# Patient Record
Sex: Female | Born: 1970 | Race: White | Hispanic: No | Marital: Married | Smoking: Current every day smoker
Health system: Southern US, Community
[De-identification: ages and names within clinical notes are randomized; demographics above are authoritative.]

## PROBLEM LIST (undated history)

## (undated) DIAGNOSIS — J410 Simple chronic bronchitis: Secondary | ICD-10-CM

## (undated) DIAGNOSIS — I2510 Atherosclerotic heart disease of native coronary artery without angina pectoris: Secondary | ICD-10-CM

## (undated) DIAGNOSIS — Z8669 Personal history of other diseases of the nervous system and sense organs: Secondary | ICD-10-CM

## (undated) DIAGNOSIS — F319 Bipolar disorder, unspecified: Secondary | ICD-10-CM

## (undated) DIAGNOSIS — T8859XA Other complications of anesthesia, initial encounter: Secondary | ICD-10-CM

## (undated) DIAGNOSIS — F32A Depression, unspecified: Secondary | ICD-10-CM

## (undated) DIAGNOSIS — R519 Headache, unspecified: Secondary | ICD-10-CM

## (undated) DIAGNOSIS — F329 Major depressive disorder, single episode, unspecified: Secondary | ICD-10-CM

## (undated) DIAGNOSIS — D649 Anemia, unspecified: Secondary | ICD-10-CM

## (undated) DIAGNOSIS — M1990 Unspecified osteoarthritis, unspecified site: Secondary | ICD-10-CM

## (undated) DIAGNOSIS — F1010 Alcohol abuse, uncomplicated: Secondary | ICD-10-CM

## (undated) DIAGNOSIS — I5189 Other ill-defined heart diseases: Secondary | ICD-10-CM

## (undated) DIAGNOSIS — K219 Gastro-esophageal reflux disease without esophagitis: Secondary | ICD-10-CM

## (undated) DIAGNOSIS — Z8659 Personal history of other mental and behavioral disorders: Secondary | ICD-10-CM

## (undated) DIAGNOSIS — I499 Cardiac arrhythmia, unspecified: Secondary | ICD-10-CM

## (undated) DIAGNOSIS — T4145XA Adverse effect of unspecified anesthetic, initial encounter: Secondary | ICD-10-CM

## (undated) DIAGNOSIS — E119 Type 2 diabetes mellitus without complications: Secondary | ICD-10-CM

## (undated) DIAGNOSIS — F1910 Other psychoactive substance abuse, uncomplicated: Secondary | ICD-10-CM

## (undated) DIAGNOSIS — E785 Hyperlipidemia, unspecified: Secondary | ICD-10-CM

## (undated) DIAGNOSIS — F209 Schizophrenia, unspecified: Secondary | ICD-10-CM

## (undated) HISTORY — DX: Type 2 diabetes mellitus without complications: E11.9

## (undated) HISTORY — PX: TUBAL LIGATION: SHX77

## (undated) HISTORY — PX: CERVIX SURGERY: SHX593

## (undated) HISTORY — DX: Hyperlipidemia, unspecified: E78.5

---

## 2006-06-09 ENCOUNTER — Emergency Department (HOSPITAL_COMMUNITY): Admission: EM | Admit: 2006-06-09 | Discharge: 2006-06-09 | Payer: Self-pay | Admitting: Emergency Medicine

## 2006-06-12 ENCOUNTER — Ambulatory Visit (HOSPITAL_COMMUNITY): Admission: RE | Admit: 2006-06-12 | Discharge: 2006-06-12 | Payer: Self-pay | Admitting: Family Medicine
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
## 2010-07-28 ENCOUNTER — Inpatient Hospital Stay: Payer: Self-pay | Admitting: Psychiatry

## 2011-02-15 ENCOUNTER — Inpatient Hospital Stay: Payer: Self-pay | Admitting: Psychiatry

## 2014-06-29 NOTE — Telephone Encounter (Signed)
open in error

## 2015-09-21 ENCOUNTER — Encounter: Payer: Self-pay | Admitting: Emergency Medicine

## 2015-09-21 ENCOUNTER — Emergency Department
Admission: EM | Admit: 2015-09-21 | Discharge: 2015-09-21 | Disposition: A | Discharge status: Home / Self Care | Payer: Medicare Other | Attending: Emergency Medicine | Admitting: Emergency Medicine

## 2015-09-21 DIAGNOSIS — F10120 Alcohol abuse with intoxication, uncomplicated: Secondary | ICD-10-CM | POA: Diagnosis not present

## 2015-09-21 DIAGNOSIS — Z720 Tobacco use: Secondary | ICD-10-CM | POA: Insufficient documentation

## 2015-09-21 DIAGNOSIS — F1910 Other psychoactive substance abuse, uncomplicated: Secondary | ICD-10-CM

## 2015-09-21 DIAGNOSIS — F10129 Alcohol abuse with intoxication, unspecified: Secondary | ICD-10-CM | POA: Diagnosis present

## 2015-09-21 DIAGNOSIS — F1410 Cocaine abuse, uncomplicated: Secondary | ICD-10-CM | POA: Diagnosis not present

## 2015-09-21 DIAGNOSIS — F10920 Alcohol use, unspecified with intoxication, uncomplicated: Secondary | ICD-10-CM

## 2015-09-21 HISTORY — DX: Major depressive disorder, single episode, unspecified: F32.9

## 2015-09-21 HISTORY — DX: Bipolar disorder, unspecified: F31.9

## 2015-09-21 HISTORY — DX: Schizophrenia, unspecified: F20.9

## 2015-09-21 HISTORY — DX: Depression, unspecified: F32.A

## 2015-09-21 HISTORY — DX: Other psychoactive substance abuse, uncomplicated: F19.10

## 2015-09-21 HISTORY — DX: Alcohol abuse, uncomplicated: F10.10

## 2015-09-21 LAB — COMPREHENSIVE METABOLIC PANEL
ALK PHOS: 116 U/L (ref 38–126)
ALT: 30 U/L (ref 14–54)
AST: 27 U/L (ref 15–41)
Albumin: 4.5 g/dL (ref 3.5–5.0)
Anion gap: 10 (ref 5–15)
BUN: 13 mg/dL (ref 6–20)
CALCIUM: 9.4 mg/dL (ref 8.9–10.3)
CO2: 26 mmol/L (ref 22–32)
CREATININE: 0.92 mg/dL (ref 0.44–1.00)
Chloride: 104 mmol/L (ref 101–111)
Glucose, Bld: 110 mg/dL — ABNORMAL HIGH (ref 65–99)
Potassium: 4 mmol/L (ref 3.5–5.1)
Sodium: 140 mmol/L (ref 135–145)
Total Bilirubin: 0.5 mg/dL (ref 0.3–1.2)
Total Protein: 8.5 g/dL — ABNORMAL HIGH (ref 6.5–8.1)

## 2015-09-21 LAB — URINE DRUG SCREEN, QUALITATIVE (ARMC ONLY)
Amphetamines, Ur Screen: NOT DETECTED
Barbiturates, Ur Screen: NOT DETECTED
Benzodiazepine, Ur Scrn: NOT DETECTED
Cannabinoid 50 Ng, Ur ~~LOC~~: NOT DETECTED
Cocaine Metabolite,Ur ~~LOC~~: POSITIVE — AB
MDMA (Ecstasy)Ur Screen: NOT DETECTED
Methadone Scn, Ur: NOT DETECTED
Opiate, Ur Screen: NOT DETECTED
Phencyclidine (PCP) Ur S: NOT DETECTED
Tricyclic, Ur Screen: NOT DETECTED

## 2015-09-21 LAB — CBC
HCT: 43 % (ref 35.0–47.0)
Hemoglobin: 14.2 g/dL (ref 12.0–16.0)
MCH: 31.9 pg (ref 26.0–34.0)
MCHC: 33.1 g/dL (ref 32.0–36.0)
MCV: 96.5 fL (ref 80.0–100.0)
PLATELETS: 370 10*3/uL (ref 150–440)
RBC: 4.46 MIL/uL (ref 3.80–5.20)
RDW: 14.1 % (ref 11.5–14.5)
WBC: 9.6 10*3/uL (ref 3.6–11.0)

## 2015-09-21 LAB — ETHANOL: ALCOHOL ETHYL (B): 192 mg/dL — AB (ref <5)

## 2015-09-21 LAB — SALICYLATE LEVEL: Salicylate Lvl: <4 mg/dL (ref 2.8–30.0)

## 2015-09-21 LAB — ACETAMINOPHEN LEVEL

## 2015-09-21 MED ORDER — HALOPERIDOL DECANOATE 100 MG/ML IM SOLN
100.0000 mg | Freq: Once | INTRAMUSCULAR | Status: AC
  Administered 2015-09-21: 100 mg via INTRAMUSCULAR
  Filled 2015-09-21: qty 1

## 2015-09-21 NOTE — ED Notes (Signed)
Just received med from pharmacy

## 2015-09-21 NOTE — ED Notes (Signed)
Pt here for drug and alcohol detox, reports hx of mental illness (schizophrenia) and is due for injection today but is too intoxicated to receive. Pt reports last alcoholic drink was around 6am, reports drinking half a gallon of liquor. Pt reports crack use, last use this morning.

## 2015-09-21 NOTE — ED Provider Notes (Signed)
[name] Emergency Department Provider Note  ____________________________________________  Time seen: Approximately 305 PM  I have reviewed the triage vital signs and the nursing notes.   HISTORY  Chief Complaint Alcohol Problem and Drug Problem    HPI Ann [name] is a 44 y.o. female with a history of schizophrenia and bipolar disorder who is presenting today from her [place] clinic for intoxication. She was supposed to get her once every 28 day Haldol shot today but was sent in to the emergency department becauseshe was intoxicated when she presented to her clinic. She says that she only drinks about once a week and only uses crack cocaine about once a month. She denies any suicidal or homicidal ideation at this time. She denies any pain. Specifically, no shortness of breath or chest pain. She denies any hallucinations. I asked her and she is not interested in detox or rehabilitation.   Past Medical History  Diagnosis Date  . Schizophrenia ([place])   . Alcohol abuse   . Drug abuse   . Bipolar 1 disorder ([place])   . Depression     There are no active problems to display for this patient.   Past Surgical History  Procedure Laterality Date  . Tubal ligation      No current outpatient prescriptions on file.  Allergies Review of patient's allergies indicates no known allergies.  No family history on file.  Social History Social History  Substance Use Topics  . Smoking status: Current Every Day Smoker    Types: Cigarettes  . Smokeless tobacco: None  . Alcohol Use: Yes     Comment: 1/2 gallon of liquor    Review of Systems Constitutional: No fever/chills Eyes: No visual changes. ENT: No sore throat. Cardiovascular: Denies chest pain. Respiratory: Denies shortness of breath. Gastrointestinal: No abdominal pain.  No nausea, no vomiting.  No diarrhea.  No constipation. Genitourinary: Negative for dysuria. Musculoskeletal: Negative for  back pain. Skin: Negative for rash. Neurological: Negative for headaches, focal weakness or numbness.  10-point ROS otherwise negative.  ____________________________________________   PHYSICAL EXAM:  VITAL SIGNS: ED Triage Vitals  Enc Vitals Group     BP 09/21/15 1310 128/78 mmHg     Pulse Rate 09/21/15 1310 80     Resp 09/21/15 1310 16     Temp 09/21/15 1310 97.7 F (36.5 C)     Temp Source 09/21/15 1310 Oral     SpO2 09/21/15 1310 100 %     Weight 09/21/15 1310 215 lb (97.523 kg)     Height 09/21/15 1310 5\' 6"  (1.676 m)     Head Cir --      Peak Flow --      Pain Score 09/21/15 1310 2     Pain Loc --      Pain Edu? --      Excl. in [place]? --     Constitutional: Alert and oriented. Well appearing and in no acute distress. Eyes: Conjunctivae are normal. PERRL. EOMI. Head: Atraumatic. Nose: No congestion/rhinnorhea. Mouth/Throat: Mucous membranes are moist.  Oropharynx non-erythematous. Neck: No stridor.   Cardiovascular: Normal rate, regular rhythm. Grossly normal heart sounds.  Good peripheral circulation. Respiratory: Normal respiratory effort.  No retractions. Lungs CTAB. Gastrointestinal: Soft and nontender. No distention. No abdominal bruits. No CVA tenderness. Musculoskeletal: No lower extremity tenderness nor edema.  No joint effusions. Neurologic:  Mild slurring of her speech consistent with intoxication. No gross focal neurologic deficits are appreciated. No gait instability. Skin:  Skin is warm, dry and intact. No rash noted. Psychiatric: Mood and affect are normal. behavior is normal.  ____________________________________________   LABS (all labs ordered are listed, but only abnormal results are displayed)  Labs Reviewed  COMPREHENSIVE METABOLIC PANEL - Abnormal; Notable for the following:    Glucose, Bld 110 (*)    Total Protein 8.5 (*)    All other components within normal limits  ETHANOL - Abnormal; Notable for the following:    Alcohol, Ethyl (B)  192 (*)    All other components within normal limits  URINE DRUG SCREEN, QUALITATIVE ([facility] ONLY) - Abnormal; Notable for the following:    Cocaine Metabolite,[place] POSITIVE (*)    All other components within normal limits  CBC  ACETAMINOPHEN LEVEL  SALICYLATE LEVEL   ____________________________________________  EKG   ____________________________________________  RADIOLOGY   ____________________________________________   PROCEDURES    ____________________________________________   INITIAL IMPRESSION / ASSESSMENT AND PLAN / ED COURSE  Pertinent labs & imaging results that were available during my care of the patient were reviewed by me and considered in my medical decision making (see chart for details).  Plan is to allow patient to sober and then give her her Haldol shot.  ----------------------------------------- 6:13 PM on 09/21/2015 -----------------------------------------  Patient is awake and alert and clinically sober at this time. She is no longer having any slurred speech. She is able to ambulate without any assistance on her own. I will give her her Haldol injection and she will follow-up with her [facility] team as scheduled. She continues to deny any suicidal or homicidal ideation or any other complaints. She continues to deny wanting any rehabilitation or detox help. She is not acutely psychotic at this time and has decisional capacity. ____________________________________________   FINAL CLINICAL IMPRESSION(S) / ED DIAGNOSES  Alcohol and cocaine abuse. Alcohol intoxication.    Ann Pyo, MD 09/21/15 [phone]

## 2015-09-21 NOTE — Discharge Instructions (Signed)
Alcohol Intoxication  Alcohol intoxication occurs when you drink enough alcohol that it affects your ability to function. It can be mild or very severe. Drinking a lot of alcohol in a short time is called binge drinking. This can be very harmful. Drinking alcohol can also be more dangerous if you are taking medicines or other drugs. Some of the effects caused by alcohol may include:  · Loss of coordination.  · Changes in mood and behavior.  · Unclear thinking.  · Trouble talking (slurred speech).  · Throwing up (vomiting).  · Confusion.  · Slowed breathing.  · Twitching and shaking (seizures).  · Loss of consciousness.  HOME CARE  · Do not drive after drinking alcohol.  · Drink enough water and fluids to keep your pee (urine) clear or pale yellow. Avoid caffeine.  · Only take medicine as told by your doctor.  GET HELP IF:  · You throw up (vomit) many times.  · You do not feel better after a few days.  · You frequently have alcohol intoxication. Your doctor can help decide if you should see a substance use treatment counselor.  GET HELP RIGHT AWAY IF:  · You become shaky when you stop drinking.  · You have twitching and shaking.  · You throw up blood. It may look bright red or like coffee grounds.  · You notice blood in your poop (bowel movements).  · You become lightheaded or pass out (faint).  MAKE SURE YOU:   · Understand these instructions.  · Will watch your condition.  · Will get help right away if you are not doing well or get worse.     This information is not intended to replace advice given to you by your health care provider. Make sure you discuss any questions you have with your health care provider.     Document Released: 04/30/2008 Document Revised: 07/15/2013 Document Reviewed: 04/17/2013  Elsevier Interactive Patient Education ©2016 [employer]

## 2015-09-21 NOTE — ED Notes (Signed)

## 2015-09-21 NOTE — ED Notes (Addendum)
I called pharmacy for IM med   Pt to be discharged to home - pending IM adminstration

## 2015-09-21 NOTE — ED Notes (Signed)
BEHAVIORAL HEALTH ROUNDING Patient sleeping: Yes.   Patient alert and oriented: eyes closed  Appears asleep Behavior appropriate: Yes.  ; If no, describe:  Nutrition and fluids offered: Yes  Toileting and hygiene offered: sleeping Sitter present: q 15 minute observations and security camera monitoring Law enforcement present: yes  ODS 

## 2015-09-21 NOTE — ED Notes (Signed)
Supper provided along with an extra drink  [place] Patient sleeping: No. Patient alert and oriented: yes Behavior appropriate: Yes.  ; If no, describe:  Nutrition and fluids offered: yes Toileting and hygiene offered: Yes  Sitter present: q15 minute observations and security camera monitoring Law enforcement present: Yes  ODS

## 2015-09-21 NOTE — ED Notes (Signed)
BEHAVIORAL HEALTH ROUNDING Patient sleeping: No. Patient alert and oriented: yes Behavior appropriate: Yes.  ; If no, describe:  Nutrition and fluids offered: yes Toileting and hygiene offered: Yes  Sitter present: q15 minute observations and security camera monitoring Law enforcement present: Yes  ODS  

## 2016-05-17 ENCOUNTER — Other Ambulatory Visit: Payer: Self-pay | Admitting: Student

## 2016-05-17 DIAGNOSIS — R1310 Dysphagia, unspecified: Secondary | ICD-10-CM

## 2016-05-22 ENCOUNTER — Ambulatory Visit: Payer: Medicare Other

## 2017-05-27 ENCOUNTER — Other Ambulatory Visit: Payer: Self-pay | Admitting: Family

## 2017-05-27 DIAGNOSIS — IMO0002 Reserved for concepts with insufficient information to code with codable children: Secondary | ICD-10-CM

## 2017-05-27 DIAGNOSIS — R229 Localized swelling, mass and lump, unspecified: Principal | ICD-10-CM

## 2017-06-06 ENCOUNTER — Ambulatory Visit: Admission: RE | Admit: 2017-06-06 | Payer: Medicare Other | Source: Ambulatory Visit

## 2017-09-09 ENCOUNTER — Ambulatory Visit
Admission: RE | Admit: 2017-09-09 | Discharge: 2017-09-09 | Disposition: A | Discharge status: Home / Self Care | Payer: Medicare Other | Source: Ambulatory Visit | Attending: Family | Admitting: Family

## 2017-09-09 DIAGNOSIS — IMO0002 Reserved for concepts with insufficient information to code with codable children: Secondary | ICD-10-CM

## 2017-09-09 DIAGNOSIS — R229 Localized swelling, mass and lump, unspecified: Secondary | ICD-10-CM

## 2017-09-09 DIAGNOSIS — R2231 Localized swelling, mass and lump, right upper limb: Secondary | ICD-10-CM | POA: Diagnosis not present

## 2018-04-23 ENCOUNTER — Other Ambulatory Visit: Payer: Self-pay

## 2018-04-23 ENCOUNTER — Encounter
Admission: RE | Admit: 2018-04-23 | Discharge: 2018-04-23 | Disposition: A | Discharge status: Home / Self Care | Payer: Medicare Other | Source: Ambulatory Visit | Attending: Obstetrics & Gynecology | Admitting: Obstetrics & Gynecology

## 2018-04-23 HISTORY — DX: Adverse effect of unspecified anesthetic, initial encounter: T41.45XA

## 2018-04-23 HISTORY — DX: Other complications of anesthesia, initial encounter: T88.59XA

## 2018-04-23 HISTORY — DX: Gastro-esophageal reflux disease without esophagitis: K21.9

## 2018-04-23 HISTORY — DX: Unspecified osteoarthritis, unspecified site: M19.90

## 2018-04-23 HISTORY — DX: Anemia, unspecified: D64.9

## 2018-04-23 NOTE — Patient Instructions (Signed)
Your procedure is scheduled on: 04-28-18 MONDAY Report to Same Day Surgery 2nd floor medical mall [facility] Entrance-take elevator on left to 2nd floor.  Check in with surgery information desk.) To find out your arrival time please call [phone] between 1PM - 3PM on 04-25-18 FRIDAY  Remember: Instructions that are not followed completely may result in serious medical risk, up to and including death, or upon the discretion of your surgeon and anesthesiologist your surgery may need to be rescheduled.    _x___ 1. Do not eat food after midnight the night before your procedure. NO GUM OR CANDY AFTER MIDNIGHT.  You may drink clear liquids up to 2 hours before you are scheduled to arrive at the hospital for your procedure.  Do not drink clear liquids within 2 hours of your scheduled arrival to the hospital.  Clear liquids include  --Water or Apple juice without pulp  --Clear carbohydrate beverage such as ClearFast or Gatorade  --Black Coffee or Clear Tea (No milk, no creamers, do not add anything to the coffee or Tea    __x__ 2. No Alcohol for 24 hours before or after surgery.   __x__3. No Smoking or e-cigarettes for 24 prior to surgery.  Do not use any chewable tobacco products for at least 6 hour prior to surgery   ____  4. Bring all medications with you on the day of surgery if instructed.    __x__ 5. Notify your doctor if there is any change in your medical condition     (cold, fever, infections).    x___6. On the morning of surgery brush your teeth with toothpaste and water.  You may rinse your mouth with mouth wash if you wish.  Do not swallow any toothpaste or mouthwash.   Do not wear jewelry, make-up, hairpins, clips or nail polish.  Do not wear lotions, powders, or perfumes. You may wear deodorant.  Do not shave 48 hours prior to surgery. Men may shave face and neck.  Do not bring valuables to the hospital.    [facility] is not responsible for any belongings or  valuables.               Contacts, dentures or bridgework may not be worn into surgery.  Leave your suitcase in the car. After surgery it may be brought to your room.  For patients admitted to the hospital, discharge time is determined by your  treatment team.  _  Patients discharged the day of surgery will not be allowed to drive home.  You will need someone to drive you home and stay with you the night of your procedure.    Please read over the following fact sheets that you were given:   [facility] Preparing for Surgery and or MRSA Information   _x___ TAKE THE FOLLOWING MEDICATIONS THE MORNING OF SURGERY WITH A SMALL SIP OF WATER. These include:  1. LIPITOR (ATORVASTATIN)  2. WELLBUTRIN (BUPROPION)  3. PROZAC (FLUOXETINE)  4. PRILOSEC (OMEPRAZOLE)  5. TAKE AN EXTRA OMEPRAZOLE THE NIGHT BEFORE YOUR SURGERY   6.  ____Fleets enema or Magnesium Citrate as directed.   _x___ Use CHG Soap or sage wipes as directed on instruction sheet   ____ Use inhalers on the day of surgery and bring to hospital day of surgery  ____ Stop Metformin and Janumet 2 days prior to surgery.    ____ Take 1/2 of usual insulin dose the night before surgery and none on the morning surgery.   ____  Follow recommendations from Cardiologist, Pulmonologist or PCP regarding stopping Aspirin, Coumadin, Plavix ,Eliquis, Effient, or Pradaxa, and Pletal.  X____Stop Anti-inflammatories such as Advil, Aleve, Ibuprofen, Motrin, Naproxen, Naprosyn, Goodies powders or aspirin products NOW-OK to take Tylenol   ____ Stop supplements until after surgery.     ____ Bring C-Pap to the hospital.

## 2018-04-24 ENCOUNTER — Encounter
Admission: RE | Admit: 2018-04-24 | Discharge: 2018-04-24 | Disposition: A | Discharge status: Home / Self Care | Payer: Medicare Other | Source: Ambulatory Visit | Attending: Obstetrics & Gynecology | Admitting: Obstetrics & Gynecology

## 2018-04-24 DIAGNOSIS — R9389 Abnormal findings on diagnostic imaging of other specified body structures: Secondary | ICD-10-CM | POA: Insufficient documentation

## 2018-04-24 DIAGNOSIS — Z825 Family history of asthma and other chronic lower respiratory diseases: Secondary | ICD-10-CM | POA: Insufficient documentation

## 2018-04-24 DIAGNOSIS — Z8371 Family history of colonic polyps: Secondary | ICD-10-CM | POA: Insufficient documentation

## 2018-04-24 DIAGNOSIS — F17210 Nicotine dependence, cigarettes, uncomplicated: Secondary | ICD-10-CM | POA: Diagnosis not present

## 2018-04-24 DIAGNOSIS — K219 Gastro-esophageal reflux disease without esophagitis: Secondary | ICD-10-CM | POA: Diagnosis not present

## 2018-04-24 DIAGNOSIS — Z8249 Family history of ischemic heart disease and other diseases of the circulatory system: Secondary | ICD-10-CM | POA: Diagnosis not present

## 2018-04-24 DIAGNOSIS — N950 Postmenopausal bleeding: Secondary | ICD-10-CM | POA: Diagnosis not present

## 2018-04-24 DIAGNOSIS — F209 Schizophrenia, unspecified: Secondary | ICD-10-CM | POA: Diagnosis not present

## 2018-04-24 DIAGNOSIS — Z79899 Other long term (current) drug therapy: Secondary | ICD-10-CM | POA: Insufficient documentation

## 2018-04-24 DIAGNOSIS — Z01812 Encounter for preprocedural laboratory examination: Secondary | ICD-10-CM | POA: Insufficient documentation

## 2018-04-24 DIAGNOSIS — E785 Hyperlipidemia, unspecified: Secondary | ICD-10-CM | POA: Insufficient documentation

## 2018-04-24 DIAGNOSIS — Z833 Family history of diabetes mellitus: Secondary | ICD-10-CM | POA: Diagnosis not present

## 2018-04-24 LAB — CBC
HEMATOCRIT: 42.4 % (ref 35.0–47.0)
HEMOGLOBIN: 14.8 g/dL (ref 12.0–16.0)
MCH: 33.9 pg (ref 26.0–34.0)
MCHC: 34.9 g/dL (ref 32.0–36.0)
MCV: 97.4 fL (ref 80.0–100.0)
Platelets: 367 10*3/uL (ref 150–440)
RBC: 4.35 MIL/uL (ref 3.80–5.20)
RDW: 13.7 % (ref 11.5–14.5)
WBC: 10 10*3/uL (ref 3.6–11.0)

## 2018-04-24 LAB — BASIC METABOLIC PANEL
Anion gap: 10 (ref 5–15)
BUN: 6 mg/dL (ref 6–20)
CHLORIDE: 103 mmol/L (ref 101–111)
CO2: 26 mmol/L (ref 22–32)
CREATININE: 0.8 mg/dL (ref 0.44–1.00)
Calcium: 9.5 mg/dL (ref 8.9–10.3)
GFR calc Af Amer: >60 mL/min (ref >60)
GFR calc non Af Amer: >60 mL/min (ref >60)
Glucose, Bld: 126 mg/dL — ABNORMAL HIGH (ref 65–99)
Potassium: 4.4 mmol/L (ref 3.5–5.1)
SODIUM: 139 mmol/L (ref 135–145)

## 2018-04-24 NOTE — Pre-Procedure Instructions (Addendum)
Called over to dr wards office on 04-23-18 and informed office dr ward needs to put her orders in.  Fax request also sent on 04-22-18 to Dr Wards office informing her she needs to put her orders in.  Pt now here in PAT and still no orders.  Only drawing labs that Anesthesia will need cbc and metb

## 2018-04-25 ENCOUNTER — Encounter: Payer: Self-pay | Admitting: *Deleted

## 2018-04-25 NOTE — Pre-Procedure Instructions (Signed)
Echocardiogram W Colorflow Spectral Doppler With Contrast10/21/2016 [facility] Result Narrative   Technically difficult study due to chest wall/lung interference  Echo contrast utilized to enhance endocardial border definition  Normal left ventricular systolic function, ejection fraction 55 to 60%  Normal right ventricular systolic function  Status Results Details   Encounter Summary

## 2018-04-25 NOTE — Pre-Procedure Instructions (Signed)
NM Myocardial Perfusion Spect Multiple10/19/2016 [facility] Result Narrative   Exam: NM MYOCARDIAL PERFUSION SPECT MULTIPLE (One-Day Rest/Stress Tc-73M  Sestamibi - Regadenoson)  ++++++++++++++++++++++++++++++++++++++++++++ Impressions: - Low risk study  - There is a small in size, mild in severity, fixed defect involving the  mid anterior and apical anterior segments.This is consistent with  attenuation artifact.  - Post stress:The ejection fraction was greater than 65%.  - Breast attenuation is noted  - Sensitivity and specificity of this test are reduced by the noted  attenuation  ++++++++++++++++++++++++++++++++++++++++++++  Name: Ann [name], Ann [name] (Female) Date of Birth: 1970-12-15 [id number]) Accession #: [id number] Candida Peeling ID: [id number] Date of Procedure: 09/14/2015 PrimaryAttending: CodySDeen  Clinical Indications: chest pain and arrhythmia Cardiac Risk Factors: diabetes and hyperlipidemia Past Cardiac Procedures: none reported Medications: none reported Other Conditions: none reported  Protocol: One-Day Rest/Stress Tc-73M Sestamibi - Regadenoson The patient was brought to the nuclear laboratory and given 11.0 mCi of  Tc-88m sestamibi intravenously.Approximately 15 minutes later, the  patient had the resting images performed.The patient was given a 0.4 mg  dose of regadenoson intravenously over 15 seconds.Exercise was not  attempted because of the patient's preference.After the IV  administration of 32.7 mCi of Tc-54m sestamibi at peak exercise, stress  tomographic images of the heart were obtained. Motion Issues: none Attenuation Issues: moderate breast Other Quality Issues: no TID, no significant gut activity was noted and no  lung uptake  Clinical Findings: Ht: 66 in (168 cm)Wt: 215 lb (97.5 kg)BSA: 2.06  m2 Exercise: No exercise is performed for this type of protocol. Baseline: HR = 67 BP = 136/74 Peak: HR = 81 BP = 124/68 Baseline EKG:  1st degree AV block and non-specific ST T-wave changes Baseline Arrhythmia: none reported Stress EKG: non-specific T-wave abnormality Stress Arrhythmia: none reported Stress Symptoms: typical regadenoson symptoms  Nuclear Perfusion Findings: There is a small in size, mild in severity, fixed defect involving the mid  anterior and apical anterior segments.This is consistent with  attenuation artifact.  Nuclear Wall Motion Findings: Post stress:Global systolic function is normal.The ejection fraction  was greater than 65%.  I ([place]) wasavailable for the stress portion of the test.   Status Results Details   Encounter Summary

## 2018-04-25 NOTE — Pre-Procedure Instructions (Signed)
PT DENIED ANY HEART ISSUES DURING PHONE INTERVIEW ON 04-23-18. PT CAME IN FOR PREOP LABS ON 04-24-18.  ALL WNL.  UPON PUTTING PTS CHART TOGETHER ON 5-31 FOR HER SURGERY ON 6-3, I FOUND A CARDIOLOGY NOTE FROM 2016 THAT STATED SVT AND POSSIBLE A-FLUTTER. PT NEVER MENTIONED THIS.  EKG WAS NOT DONE WHEN SHE CAME IN FOR HER LABS.  STRESS AND ECHO DONE IN 2016.  WILL DO EKG AM OF SURGERY

## 2018-04-25 NOTE — Pre-Procedure Instructions (Signed)
Progress Notes - documented in this encounter  Baxter Hire, MD - 10/13/2015 9:37 AM EST Formatting of this note might be different from the original.   [facility] [place]  Date of Service: 10/13/2015  New Consultation Clinic Note  PCP: Referring Provider:  Neysa Hotter, PA [address] [place] [address] Phone: [phone] Fax: [place], [place] [address] [place], [place] [address] Phone: [phone] Fax: [phone]   Assessment and Plan:   [name] is a 47 y.o. female with past medical history of diabetes mellitus, hyperlipidemia, who was referred to [place] for possible "atrial flutter" on recent EKG, and the symptoms of chest pain.    1. Chest pain. Patient reported nonexertional intermittent chest pain has resolved. Nuclear stress test and echocardiography were both unremarkable. Patient will be discharged to her PCP, and will call us back if her symptoms worsen or any new symptoms developed.   2. Questionable atrial flutter. Zio patch for 10 days revealed predominately sinus rhythm, occasional SVT, no atrial flutter/fibrillation. She will be monitored by her PCP for new or worsening symptoms. A longer duration of monitoring will be considered if she still has symptoms.   3. Hyperlipidemia.Managed by her primary care physician.  4. Diabetes mellitus. Managed by her primary care physician.   Return if symptoms worsen or fail to improve.  No orders of the defined types were placed in this encounter.   The patient was seen and discussed with Dr. Charletta Cousin.  Subjective:    Reason for Consultation: Chest pain and possible atrial flutter  History of Present Illness: [name] is a 47 y.o. female with past medical history of diabetes mellitus, hyperlipidemia, .The patient is seen at the request of Neysa Hotter, [place], for evaluation of chest pain and possible atrial flutter.  Patient reported that the  previously reported nonexertional intermittent chest pain has completely resolved. She has currently denied any symptoms of chest pain, shortness of breath, dizziness, palpitations, or any other symptoms.  Her recent nuclear stress test and echocardiogram were both unremarkable. A 10 day course monitoring of her heart rhythm by a Ziopatch failed to reveal any atrial flutter/fibrillation.   Cardiovascular History and relevant Past Medical History: Hyperlipidemia Diabetes mellitus  Cardiovascular Studies Date Comments  Nuclear Stress test 09/14/2015 Low risk study  - There is a small in size, mild in severity, fixed defect involving the mid anterior and apical anterior segments. This is consistent with attenuation artifact.  - Post stress: The ejection fraction was greater than 65%.  - Breast attenuation is noted  - Sensitivity and specificity of this test are reduced by the noted attenuation  Echocardiogram 09/16/2015  Technically difficult study due to chest wall/lung interference  Echo contrast utilized to enhance endocardial border definition  Normal left ventricular systolic function, ejection fraction 55 to 60%  Normal right ventricular systolic function  Ziopatch 09/26/2015 Ambulatory ECG monitoring was performed from 09/16/2015 to 09/26/2015. - The predominant rhythm was sinus rhythm, with the rate ranging from 42 to 143 and averaging 81 bpm. - Rare supraventricular ectopics were recorded, with no complex or sustained arrhythmias.  - No ventricular ectopics were recorded, with no complex or sustained arrhythmias. - Patient-initiated recordings/events revealed sinus rhythm. - The patient reported chest pain and palpitations. - No presence of atrial flutter.  ECG 09/01/2015 Sinus rhythm, nonspecific ST T changes.  Past medical history was reviewed in EPIC.  Medications:  Current Outpatient Prescriptions  Medication  Sig Dispense Refill  . atorvastatin (LIPITOR) 40 MG  tablet Take 40 mg by mouth daily.  Marland Kitchen buPROPion (WELLBUTRIN XL) 300 MG 24 hr tablet Take 300 mg by mouth daily.  . calcium carbonate-vitamin D3 600 mg(1,500mg ) -200 unit per tablet Take 1 tablet by mouth daily.  Marland Kitchen FLUoxetine (PROZAC) 20 MG capsule Take 20 mg by mouth daily.  . multivit-iron-min-folic acid (MULTIVITAMIN-IRON-MINERALS-FOLIC ACID) 2,563-89-3.7 unit-mg-mg Chew Chew 1 tablet daily.  Marland Kitchen omeprazole (PRILOSEC) 20 MG capsule Take 20 mg by mouth daily.  . naproxen (NAPROSYN) 500 MG tablet Take 500 mg by mouth every twelve (12) hours as needed.   No current facility-administered medications for this visit.   Allergies: No Known Allergies  Social History: She reports that she has been smoking Cigarettes. She has been smoking about 0.50 packs per day. She does not have any smokeless tobacco history on file. She reports that she drinks about 3.6 - 4.8 oz of alcohol per week. She reports that she does not use illicit drugs. Family History:Her family history is not on file.  Review of Systems 10 systems were reviewed and negative except as noted in HPI.   Objective:   Physical Exam BP 104/71 mmHg  Pulse 68  Wt 96.707 kg (213 lb 3.2 oz)  SpO2 98%  Wt Readings from Last 3 Encounters:  10/13/15 96.707 kg (213 lb 3.2 oz)  09/01/15 97.523 kg (215 lb)   General: Alert, no distress.  HEENT: EOMI, sclerae anicteric, MMM.  Neck: Supple, no carotid bruit. JVD normal.  Lungs: CTAB bilaterally with normal WOB.  Heart: RRR without m/r/g.  Abdomen: s/NTND.  Extremities: 2+ radial, PT and DP pulses bilaterally. No edema bilaterally.  Skin: No lesions/rashes.  Neurologic: No focal deficits.   Most recent labs  Lab Results  Component Value Date  NA 143 05/31/2011  K 4.4 05/31/2011  CL 103 05/31/2011  CO2 28 05/31/2011  BUN 12 05/31/2011  CREATININE 0.74 05/31/2011  MG 2.1 05/15/2011   Lab Results  Component Value Date  HGB 14.1 05/10/2011  MCV 95 05/10/2011  PLT 403 05/10/2011     Lab Results  Component Value Date  CHOL 258* 05/31/2011  TRIG 176* 05/31/2011  HDL 47 05/31/2011  NONHDL 211 05/31/2011  LDL 176 05/31/2011  TSH 1.53 05/10/2011     Baxter Hire, MD Cardiovascular Diseases Subspecialty Resident, PGY 5   Electronically signed by Angela Burke, MD at 10/13/2015 5:32 PM EST    Associated attestation - Angela Burke, MD - 10/13/2015 5:32 PM EST  I saw and evaluated the patient, participating in the key portions of the service. I reviewed the resident's note. I agree with the resident's findings and plan.   Marjean Donna, MD      Plan of Treatment - documented as of this encounter  Not on file    Visit Diagnoses - documented in this encounter  Diagnosis  Chest pain, unspecified type - Primary   No evidence of atrial fibrillation or flutter   Hyperlipidemia, unspecified hyperlipidemia type    Images Patient Contacts   Contact Name Contact Address Communication Relationship to Patient  Laverta [place] Unknown [phone] [facility]) Emergency Contact   Document Information  Primary Care Provider Other Service Providers Document Coverage Dates  Neysa Hotter PA (Sep. 19, 2016September 19, 2016 - Jul. 10, 2017July 10, 2017)   Nov. 17, 2016November 17, 2016   [place] [address] [place], [place] [address]   Encounter Providers  Encounter Date  Tacy Learn MD (Attending) [phone] (Work) [phone] (Fax) [address] [place], [place] [address]  Nov. 17, 2016November 17, 2016

## 2018-04-28 ENCOUNTER — Encounter
Admission: RE | Disposition: A | Discharge status: Home / Self Care | Payer: Self-pay | Source: Ambulatory Visit | Attending: Obstetrics & Gynecology

## 2018-04-28 ENCOUNTER — Ambulatory Visit: Payer: Medicare Other | Admitting: Anesthesiology

## 2018-04-28 ENCOUNTER — Encounter: Payer: Self-pay | Admitting: *Deleted

## 2018-04-28 ENCOUNTER — Ambulatory Visit
Admission: RE | Admit: 2018-04-28 | Discharge: 2018-04-28 | Disposition: A | Discharge status: Home / Self Care | Payer: Medicare Other | Source: Ambulatory Visit | Attending: Obstetrics & Gynecology | Admitting: Obstetrics & Gynecology

## 2018-04-28 DIAGNOSIS — N840 Polyp of corpus uteri: Secondary | ICD-10-CM | POA: Insufficient documentation

## 2018-04-28 DIAGNOSIS — N950 Postmenopausal bleeding: Secondary | ICD-10-CM

## 2018-04-28 DIAGNOSIS — Z79899 Other long term (current) drug therapy: Secondary | ICD-10-CM | POA: Diagnosis not present

## 2018-04-28 DIAGNOSIS — F17210 Nicotine dependence, cigarettes, uncomplicated: Secondary | ICD-10-CM | POA: Insufficient documentation

## 2018-04-28 DIAGNOSIS — N939 Abnormal uterine and vaginal bleeding, unspecified: Secondary | ICD-10-CM | POA: Diagnosis present

## 2018-04-28 DIAGNOSIS — R9389 Abnormal findings on diagnostic imaging of other specified body structures: Secondary | ICD-10-CM | POA: Diagnosis present

## 2018-04-28 DIAGNOSIS — F209 Schizophrenia, unspecified: Secondary | ICD-10-CM | POA: Insufficient documentation

## 2018-04-28 DIAGNOSIS — F319 Bipolar disorder, unspecified: Secondary | ICD-10-CM | POA: Diagnosis not present

## 2018-04-28 DIAGNOSIS — D649 Anemia, unspecified: Secondary | ICD-10-CM | POA: Insufficient documentation

## 2018-04-28 DIAGNOSIS — K219 Gastro-esophageal reflux disease without esophagitis: Secondary | ICD-10-CM | POA: Insufficient documentation

## 2018-04-28 HISTORY — DX: Cardiac arrhythmia, unspecified: I49.9

## 2018-04-28 HISTORY — PX: HYSTEROSCOPY W/D&C: SHX1775

## 2018-04-28 HISTORY — PX: POLYPECTOMY: SHX5525

## 2018-04-28 LAB — URINE DRUG SCREEN, QUALITATIVE (ARMC ONLY)
Amphetamines, Ur Screen: NOT DETECTED
Barbiturates, Ur Screen: NOT DETECTED
Benzodiazepine, Ur Scrn: NOT DETECTED
CANNABINOID 50 NG, UR ~~LOC~~: NOT DETECTED
Cocaine Metabolite,Ur ~~LOC~~: NOT DETECTED
MDMA (ECSTASY) UR SCREEN: NOT DETECTED
Methadone Scn, Ur: NOT DETECTED
Opiate, Ur Screen: NOT DETECTED
PHENCYCLIDINE (PCP) UR S: NOT DETECTED
Tricyclic, Ur Screen: NOT DETECTED

## 2018-04-28 LAB — POCT PREGNANCY, URINE: PREG TEST UR: NEGATIVE

## 2018-04-28 SURGERY — DILATATION AND CURETTAGE /HYSTEROSCOPY
Anesthesia: General | Wound class: Clean Contaminated

## 2018-04-28 MED ORDER — ONDANSETRON HCL 4 MG/2ML IJ SOLN
INTRAMUSCULAR | Status: AC
  Filled 2018-04-28: qty 2

## 2018-04-28 MED ORDER — ACETAMINOPHEN 325 MG PO TABS: 650.0000 mg | ORAL_TABLET | ORAL | Status: DC | PRN

## 2018-04-28 MED ORDER — ROCURONIUM BROMIDE 100 MG/10ML IV SOLN
INTRAVENOUS | Status: DC | PRN
  Administered 2018-04-28: 5 mg via INTRAVENOUS
  Administered 2018-04-28: 20 mg via INTRAVENOUS

## 2018-04-28 MED ORDER — ACETAMINOPHEN 650 MG RE SUPP
650.0000 mg | RECTAL | Status: DC | PRN
  Filled 2018-04-28: qty 1

## 2018-04-28 MED ORDER — ROCURONIUM BROMIDE 50 MG/5ML IV SOLN
INTRAVENOUS | Status: AC
  Filled 2018-04-28: qty 1

## 2018-04-28 MED ORDER — MIDAZOLAM HCL 2 MG/2ML IJ SOLN
INTRAMUSCULAR | Status: AC
  Filled 2018-04-28: qty 2

## 2018-04-28 MED ORDER — IPRATROPIUM-ALBUTEROL 0.5-2.5 (3) MG/3ML IN SOLN
RESPIRATORY_TRACT | Status: AC
  Filled 2018-04-28: qty 3

## 2018-04-28 MED ORDER — FENTANYL CITRATE (PF) 100 MCG/2ML IJ SOLN
INTRAMUSCULAR | Status: DC | PRN
  Administered 2018-04-28: 50 ug via INTRAVENOUS

## 2018-04-28 MED ORDER — SUCCINYLCHOLINE CHLORIDE 20 MG/ML IJ SOLN
INTRAMUSCULAR | Status: DC | PRN
  Administered 2018-04-28: 100 mg via INTRAVENOUS

## 2018-04-28 MED ORDER — FENTANYL CITRATE (PF) 100 MCG/2ML IJ SOLN: 25.0000 ug | INTRAMUSCULAR | Status: DC | PRN

## 2018-04-28 MED ORDER — ONDANSETRON HCL 4 MG/2ML IJ SOLN: 4.0000 mg | Freq: Once | INTRAMUSCULAR | Status: DC | PRN

## 2018-04-28 MED ORDER — DEXAMETHASONE SODIUM PHOSPHATE 10 MG/ML IJ SOLN
INTRAMUSCULAR | Status: AC
  Filled 2018-04-28: qty 1

## 2018-04-28 MED ORDER — SUCCINYLCHOLINE CHLORIDE 20 MG/ML IJ SOLN
INTRAMUSCULAR | Status: AC
  Filled 2018-04-28: qty 1

## 2018-04-28 MED ORDER — PROPOFOL 10 MG/ML IV BOLUS
INTRAVENOUS | Status: AC
  Filled 2018-04-28: qty 20

## 2018-04-28 MED ORDER — PROPOFOL 10 MG/ML IV BOLUS
INTRAVENOUS | Status: DC | PRN
  Administered 2018-04-28: 150 mg via INTRAVENOUS

## 2018-04-28 MED ORDER — LIDOCAINE HCL (CARDIAC) PF 100 MG/5ML IV SOSY
PREFILLED_SYRINGE | INTRAVENOUS | Status: DC | PRN
  Administered 2018-04-28: 100 mg via INTRAVENOUS

## 2018-04-28 MED ORDER — KETOROLAC TROMETHAMINE 30 MG/ML IJ SOLN
30.0000 mg | Freq: Four times a day (QID) | INTRAMUSCULAR | Status: DC
  Filled 2018-04-28: qty 1

## 2018-04-28 MED ORDER — SUGAMMADEX SODIUM 200 MG/2ML IV SOLN
INTRAVENOUS | Status: DC | PRN
  Administered 2018-04-28: 100 mg via INTRAVENOUS

## 2018-04-28 MED ORDER — ONDANSETRON HCL 4 MG/2ML IJ SOLN
INTRAMUSCULAR | Status: DC | PRN
  Administered 2018-04-28: 4 mg via INTRAVENOUS

## 2018-04-28 MED ORDER — MIDAZOLAM HCL 2 MG/2ML IJ SOLN
INTRAMUSCULAR | Status: DC | PRN
  Administered 2018-04-28: 2 mg via INTRAVENOUS

## 2018-04-28 MED ORDER — LIDOCAINE HCL (PF) 2 % IJ SOLN
INTRAMUSCULAR | Status: AC
  Filled 2018-04-28: qty 10

## 2018-04-28 MED ORDER — FENTANYL CITRATE (PF) 100 MCG/2ML IJ SOLN
INTRAMUSCULAR | Status: AC
  Filled 2018-04-28: qty 2

## 2018-04-28 MED ORDER — KETOROLAC TROMETHAMINE 30 MG/ML IJ SOLN
INTRAMUSCULAR | Status: AC
  Filled 2018-04-28: qty 1

## 2018-04-28 MED ORDER — IPRATROPIUM-ALBUTEROL 0.5-2.5 (3) MG/3ML IN SOLN
3.0000 mL | RESPIRATORY_TRACT | Status: DC
  Administered 2018-04-28: 3 mL via RESPIRATORY_TRACT

## 2018-04-28 MED ORDER — LACTATED RINGERS IV SOLN
INTRAVENOUS | Status: DC
  Administered 2018-04-28: 11:00:00 via INTRAVENOUS

## 2018-04-28 MED ORDER — LACTATED RINGERS IV SOLN: INTRAVENOUS | Status: DC

## 2018-04-28 MED ORDER — SUGAMMADEX SODIUM 200 MG/2ML IV SOLN
INTRAVENOUS | Status: AC
  Filled 2018-04-28: qty 2

## 2018-04-28 MED ORDER — MORPHINE SULFATE (PF) 4 MG/ML IV SOLN: 1.0000 mg | INTRAVENOUS | Status: DC | PRN

## 2018-04-28 MED ORDER — DEXAMETHASONE SODIUM PHOSPHATE 10 MG/ML IJ SOLN
INTRAMUSCULAR | Status: DC | PRN
  Administered 2018-04-28: 5 mg via INTRAVENOUS

## 2018-04-28 SURGICAL SUPPLY — 22 items
CANISTER SUC SOCK COL 7IN (MISCELLANEOUS) ×4 IMPLANT
CATH ROBINSON RED A/P 16FR (CATHETERS) ×4 IMPLANT
DEVICE MYOSURE LITE (MISCELLANEOUS) IMPLANT
DEVICE MYOSURE REACH (MISCELLANEOUS) IMPLANT
ELECT REM PT RETURN 9FT ADLT (ELECTROSURGICAL) ×4
ELECTRODE REM PT RTRN 9FT ADLT (ELECTROSURGICAL) ×2 IMPLANT
GLOVE PI ORTHOPRO 6.5 (GLOVE) ×2
GLOVE PI ORTHOPRO STRL 6.5 (GLOVE) ×2 IMPLANT
GLOVE SURG SYN 6.5 ES PF (GLOVE) ×8 IMPLANT
GOWN STRL REUS W/ TWL LRG LVL3 (GOWN DISPOSABLE) ×4 IMPLANT
GOWN STRL REUS W/TWL LRG LVL3 (GOWN DISPOSABLE) ×8
KIT TURNOVER CYSTO (KITS) ×4 IMPLANT
PACK DNC HYST (MISCELLANEOUS) ×4 IMPLANT
PAD OB MATERNITY 4.3X12.25 (PERSONAL CARE ITEMS) ×4 IMPLANT
PAD PREP 24X41 OB/GYN DISP (PERSONAL CARE ITEMS) ×4 IMPLANT
SEAL ROD LENS SCOPE MYOSURE (ABLATOR) ×4 IMPLANT
SOL .9 NS 3000ML IRR  AL (IV SOLUTION) ×2
SOL .9 NS 3000ML IRR AL (IV SOLUTION) ×2
SOL .9 NS 3000ML IRR UROMATIC (IV SOLUTION) ×2 IMPLANT
TUBING CONNECTING 10 (TUBING) ×3 IMPLANT
TUBING CONNECTING 10' (TUBING) ×1
TUBING HYSTEROSCOPY DOLPHIN (MISCELLANEOUS) IMPLANT

## 2018-04-28 NOTE — Anesthesia Preprocedure Evaluation (Addendum)
Anesthesia Evaluation  Patient identified by MRN, date of birth, ID band Patient awake    Reviewed: Allergy & Precautions, NPO status , Patient's Chart, lab work & pertinent test results  History of Anesthesia Complications (+) PROLONGED EMERGENCE and history of anesthetic complications  Airway Mallampati: III  TM Distance: <3 FB     Dental  (+) Chipped   Pulmonary Current Smoker,    Pulmonary exam normal        Cardiovascular Normal cardiovascular exam+ dysrhythmias Supra Ventricular Tachycardia      Neuro/Psych PSYCHIATRIC DISORDERS Depression Bipolar Disorder Schizophrenia negative neurological ROS     GI/Hepatic GERD  Poorly Controlled,(+)     substance abuse  ,   Endo/Other  negative endocrine ROS  Renal/GU negative Renal ROS  negative genitourinary   Musculoskeletal  (+) Arthritis , Osteoarthritis,    Abdominal Normal abdominal exam  (+)   Peds negative pediatric ROS (+)  Hematology  (+) anemia ,   Anesthesia Other Findings   Reproductive/Obstetrics                             Anesthesia Physical Anesthesia Plan  ASA: II  Anesthesia Plan: General   Post-op Pain Management:    Induction: Intravenous, Rapid sequence and Cricoid pressure planned  PONV Risk Score and Plan:   Airway Management Planned: Oral ETT  Additional Equipment:   Intra-op Plan:   Post-operative Plan: Extubation in OR  Informed Consent: I have reviewed the patients History and Physical, chart, labs and discussed the procedure including the risks, benefits and alternatives for the proposed anesthesia with the patient or authorized representative who has indicated his/her understanding and acceptance.   Dental advisory given  Plan Discussed with: CRNA and Surgeon  Anesthesia Plan Comments:         Anesthesia Quick Evaluation

## 2018-04-28 NOTE — Discharge Instructions (Addendum)
You should expect to have some cramping and vaginal bleeding for about a week. This should taper off and subside, much like a period. If heavy bleeding continues or gets worse, you should contact the office for an earlier appointment.   Please call the office or physician on call for fever >101, severe pain, and heavy bleeding.   [phone]  NOTHING IN THE VAGINA FOR 2 WEEKS!!  Dr. Ward will discuss pathology results with you at your postop visit.    AMBULATORY SURGERY  DISCHARGE INSTRUCTIONS   1) The drugs that you were given will stay in your system until tomorrow so for the next 24 hours you should not:  A) Drive an automobile B) Make any legal decisions C) Drink any alcoholic beverage   2) You may resume regular meals tomorrow.  Today it is better to start with liquids and gradually work up to solid foods.  You may eat anything you prefer, but it is better to start with liquids, then soup and crackers, and gradually work up to solid foods.   3) Please notify your doctor immediately if you have any unusual bleeding, trouble breathing, redness and pain at the surgery site, drainage, fever, or pain not relieved by medication.    4) Additional Instructions:        Please contact your physician with any problems or Same Day Surgery at [phone], Monday through Friday 6 am to 4 pm, or [facility] number at [phone].  

## 2018-04-28 NOTE — Op Note (Signed)
Operative Report Hysteroscopy, Dilation and Curettage 04/28/2018  Patient:  Ann [name]  47 y.o. female Preoperative diagnosis:  abnormal uterine bleeding, thickened endometrium Postoperative diagnosis:  Same, endometrial polyp  PROCEDURE:  Procedure(s): DILATATION AND CURETTAGE /HYSTEROSCOPY (N/A) POLYPECTOMY Surgeon:  Surgeon(s) and Role:    * Ward, Honor Loh, MD - Primary Anesthesia:  LMA I/O: Total I/O In: 350 [I.V.:350] Out: 2 [Blood:2] Specimens:  Endometrial curettings, endometrial polyp Complications: None Apparent Disposition:  VS stable to PACU  Findings: Uterus, mobile, normal size, sounding to 8cm; normal cervix, vagina, perineum.  Hysteroscopy: protruding polyp through the cervical canal, with base on left lateral wall.  Indication for procedure/Consents: 47 y.o.  here for scheduled surgery for the aforementioned diagnoses.  Risks of surgery were discussed with the patient including but not limited to: bleeding which may require transfusion; infection which may require antibiotics; injury to uterus or surrounding organs; intrauterine scarring which may impair future fertility; need for additional procedures including laparotomy or laparoscopy; and other postoperative/anesthesia complications. Written informed consent was obtained.    Procedure Details:   The patient was then taken to the operating room where anesthesia was administered and was found to be adequate.  After a formal timeout was performed, she was placed in the dorsal lithotomy position and examined with the above findings. She was then prepped and draped in the sterile manner.  A speculum was then placed in the patient's vagina and a single tooth tenaculum was applied to the anterior lip of the cervix.  The uterus was sounded to 8cm. Her cervix was serially dilated to accommodate the myoscope, with findings as above. Forceps were placed in the uterus and the polyp was removed. A sharp curettage was then  performed until there was a gritty texture in all four quadrants. specimens were handed off to nursing.  The camera was reinserted and confirmed the uterus had been evacuated. The tenaculum was removed from the anterior lip of the cervix and the vaginal speculum was removed after noting good hemostasis. The patient tolerated the procedure well and was taken to the recovery area awake, extubated and in stable condition.  The patient will be discharged to home as per PACU criteria.  Routine postoperative instructions given. She will follow up in the clinic in two to four weeks for postoperative evaluation.  Larey Days, MD [facility] OBGYN Attending Gynecologist

## 2018-04-28 NOTE — Transfer of Care (Signed)
Immediate Anesthesia Transfer of Care Note  Patient: Ann [name]  Procedure(s) Performed: DILATATION AND CURETTAGE /HYSTEROSCOPY (N/A ) POLYPECTOMY  Patient Location: PACU  Anesthesia Type:General  Level of Consciousness: awake  Airway & Oxygen Therapy: Patient Spontanous Breathing  Post-op Assessment: Report given to RN  Post vital signs: stable  Last Vitals:  Vitals Value Taken Time  BP 87/77 04/28/2018 12:09 PM  Temp 36.5 C 04/28/2018 12:09 PM  Pulse 101 04/28/2018 12:12 PM  Resp 19 04/28/2018 12:12 PM  SpO2 97 % 04/28/2018 12:12 PM  Vitals shown include unvalidated device data.  Last Pain:  Vitals:   04/28/18 1209  PainSc: 0-No pain         Complications: No apparent anesthesia complications

## 2018-04-28 NOTE — Anesthesia Post-op Follow-up Note (Signed)
Anesthesia QCDR form completed.        

## 2018-04-28 NOTE — Anesthesia Procedure Notes (Signed)
Procedure Name: Intubation Date/Time: 04/28/2018 11:24 AM Performed by: Zetta Bills, CRNA Pre-anesthesia Checklist: Patient identified, Emergency Drugs available, Suction available and Patient being monitored Patient Re-evaluated:Patient Re-evaluated prior to induction Oxygen Delivery Method: Circle system utilized Preoxygenation: Pre-oxygenation with 100% oxygen Induction Type: IV induction Laryngoscope Size: Mac and 3 Grade View: Grade I Tube type: Oral Tube size: 7.0 mm Number of attempts: 1 Airway Equipment and Method: Stylet

## 2018-04-28 NOTE — H&P (Signed)
Preoperative History and Physical  Ann [name] is a 47 y.o. postmenopausal bleeding, with thickened endometrium   Proposed surgery: dilation and curettage, with hysteroscopy  Past Medical History:  Diagnosis Date  . Alcohol abuse   . Anemia   . Arthritis    KNEE  . Bipolar 1 disorder ([place])   . Complication of anesthesia    HARD TO WAKE UP WITH CERVIX SURGERY  . Depression   . Drug abuse ([place])   . Dysrhythmia    SVT PER CARDIOLOGY NOTE IN 2016-POSSIBLE ATRIAL FLUTTER????    . GERD (gastroesophageal reflux disease)   . Schizophrenia [facility])    Past Surgical History:  Procedure Laterality Date  . CERVIX SURGERY    . TUBAL LIGATION     OB History  No data available  Patient denies any other pertinent gynecologic issues.   No current facility-administered medications on file prior to encounter.    Current Outpatient Medications on File Prior to Encounter  Medication Sig Dispense Refill  . atorvastatin (LIPITOR) 80 MG tablet Take 80 mg by mouth every morning.     Marland Kitchen buPROPion (WELLBUTRIN XL) 300 MG 24 hr tablet Take 300 mg by mouth every morning.     . cholecalciferol (VITAMIN D) 1000 units tablet Take 1,000 Units by mouth daily.    Marland Kitchen FLUoxetine (PROZAC) 20 MG capsule Take 60 mg by mouth every morning.     . haloperidol lactate (HALDOL) 5 MG/ML injection Inject 100 mg into the muscle every 30 (thirty) days.     Marland Kitchen omeprazole (PRILOSEC) 20 MG capsule Take 40 mg by mouth every morning.     . Potassium 99 MG TABS Take 1 tablet by mouth daily.     No Known Allergies  Social History:   reports that she has been smoking cigarettes.  She has a 90.00 pack-year smoking history. She has never used smokeless tobacco. She reports that she drank alcohol. She reports that she has current or past drug history. Drugs: Cocaine and Marijuana.  History reviewed. No pertinent family history.  Review of Systems: Noncontributory  PHYSICAL EXAM: Height 5\' 6"  (1.676 m), weight 102.5 kg (226  lb). General appearance - alert, well appearing, and in no distress Chest - clear to auscultation, no wheezes, rales or rhonchi, symmetric air entry Heart - normal rate and regular rhythm Abdomen - soft, nontender, nondistended, no masses or organomegaly Pelvic - examination not indicated Extremities - peripheral pulses normal, no pedal edema, no clubbing or cyanosis  Labs: Results for orders placed or performed during the hospital encounter of 04/28/18 (from the past 336 hour(s))  Urine Drug Screen, Qualitative ([place] only)   Collection Time: 04/28/18 10:19 AM  Result Value Ref Range   Tricyclic, Ur Screen NONE DETECTED NONE DETECTED   Amphetamines, Ur Screen NONE DETECTED NONE DETECTED   MDMA (Ecstasy)Ur Screen NONE DETECTED NONE DETECTED   Cocaine Metabolite,Ur [place] NONE DETECTED NONE DETECTED   Opiate, Ur Screen NONE DETECTED NONE DETECTED   Phencyclidine (PCP) Ur S NONE DETECTED NONE DETECTED   Cannabinoid 50 Ng, Ur [place] NONE DETECTED NONE DETECTED   Barbiturates, Ur Screen NONE DETECTED NONE DETECTED   Benzodiazepine, Ur Scrn NONE DETECTED NONE DETECTED   Methadone Scn, Ur NONE DETECTED NONE DETECTED  Pregnancy, urine POC   Collection Time: 04/28/18 10:20 AM  Result Value Ref Range   Preg Test, Ur NEGATIVE NEGATIVE  Results for orders placed or performed during the hospital encounter of 04/24/18 (from the past 336 hour(s))  CBC   Collection Time: 04/24/18  9:38 AM  Result Value Ref Range   WBC 10.0 3.6 - 11.0 K/uL   RBC 4.35 3.80 - 5.20 MIL/uL   Hemoglobin 14.8 12.0 - 16.0 g/dL   HCT 42.4 35.0 - 47.0 %   MCV 97.4 80.0 - 100.0 fL   MCH 33.9 26.0 - 34.0 pg   MCHC 34.9 32.0 - 36.0 g/dL   RDW 13.7 11.5 - 14.5 %   Platelets 367 150 - 440 K/uL  Basic metabolic panel   Collection Time: 04/24/18  9:38 AM  Result Value Ref Range   Sodium 139 135 - 145 mmol/L   Potassium 4.4 3.5 - 5.1 mmol/L   Chloride 103 101 - 111 mmol/L   CO2 26 22 - 32 mmol/L   Glucose, Bld 126 (H) 65 - 99  mg/dL   BUN 6 6 - 20 mg/dL   Creatinine, Ser 0.80 0.44 - 1.00 mg/dL   Calcium 9.5 8.9 - 10.3 mg/dL   GFR calc non Af Amer >60 >60 mL/min   GFR calc Af Amer >60 >60 mL/min   Anion gap 10 5 - 15    Imaging Studies: No results found.  Assessment: Patient Active Problem List   Diagnosis Date Noted  . Thickened endometrium 04/28/2018  . Postmenopausal bleeding 04/28/2018    Plan: Patient will undergo surgical management with dilation and curettage, hysteroscopy.   The risks of surgery were discussed in detail with the patient including but not limited to: bleeding which may require transfusion or reoperation; infection which may require antibiotics; injury to surrounding organs which may involve bowel, bladder, ureters ; need for additional procedures including laparoscopy or laparotomy; thromboembolic phenomenon, surgical site problems and other postoperative/anesthesia complications. Likelihood of success in alleviating the patient's condition was discussed. Routine postoperative instructions will be reviewed with the patient and her family in detail after surgery.  The patient concurred with the proposed plan, giving informed written consent for the surgery.  Patient has been NPO since last night she will remain NPO for procedure.  Anesthesia and OR aware.     ----- Larey Days, MD Attending Obstetrician and Gynecologist [facility], Department of [facility]

## 2018-04-29 LAB — SURGICAL PATHOLOGY

## 2018-04-29 NOTE — Anesthesia Postprocedure Evaluation (Signed)
Anesthesia Post Note  Patient: Jeanie A Cannon  Procedure(s) Performed: DILATATION AND CURETTAGE /HYSTEROSCOPY (N/A ) POLYPECTOMY  Patient location during evaluation: PACU Anesthesia Type: General Level of consciousness: awake and alert and oriented Pain management: pain level controlled Vital Signs Assessment: post-procedure vital signs reviewed and stable Respiratory status: spontaneous breathing Cardiovascular status: blood pressure returned to baseline Anesthetic complications: no     Last Vitals:  Vitals:   04/28/18 1248 04/28/18 1305  BP: 111/66 (!) 117/57  Pulse: 96 90  Resp: 20 20  Temp: (!) 36.3 C 36.7 C  SpO2: 95% 96%    Last Pain:  Vitals:   04/29/18 0855  TempSrc:   PainSc: 0-No pain                 Jaydan Meidinger

## 2018-10-31 IMAGING — US US EXTREM UP *R* LTD
1 series · 14 of 25 positions shown · non-contrast
Comparison: None.

CLINICAL DATA: Three palpable lesions along the right upper lateral
arm present for the past 2-3 years.

EXAM:
ULTRASOUND RIGHT UPPER EXTREMITY LIMITED
TECHNIQUE: Ultrasound examination of the upper extremity soft tissues was
performed in the area of clinical concern.

[Series 1: us extrem up *right* ltd · 0.07mm/px · 14 of 28 slices shown]
[im 1/28]
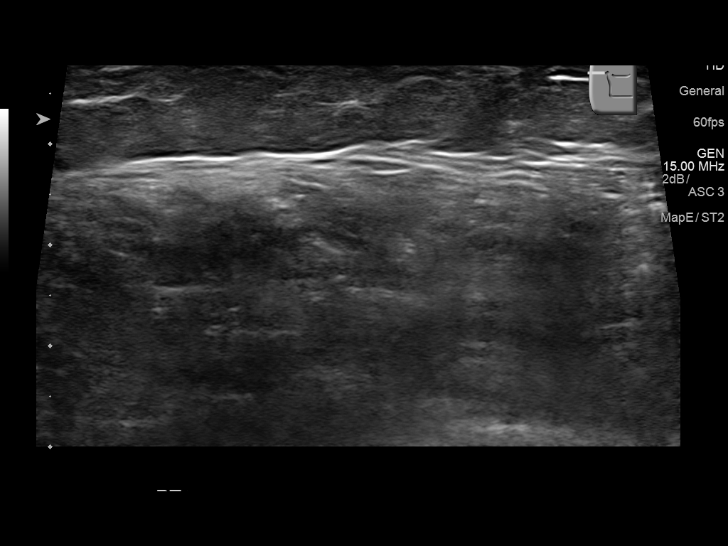
[im 3/28]
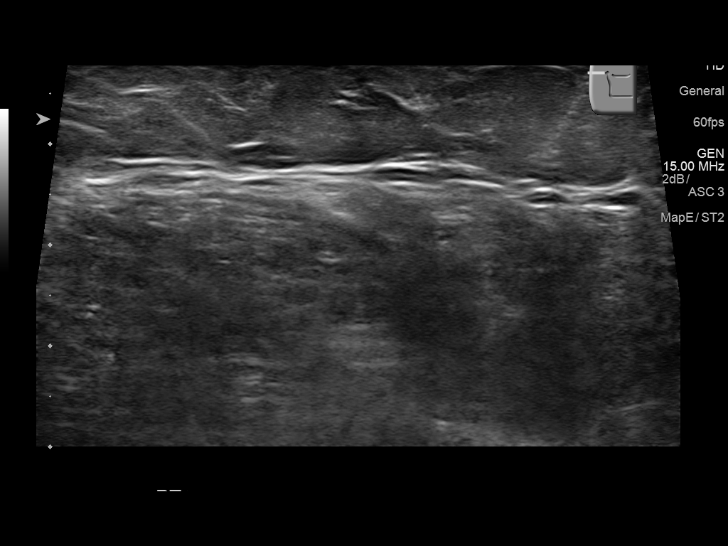
[im 5/28]
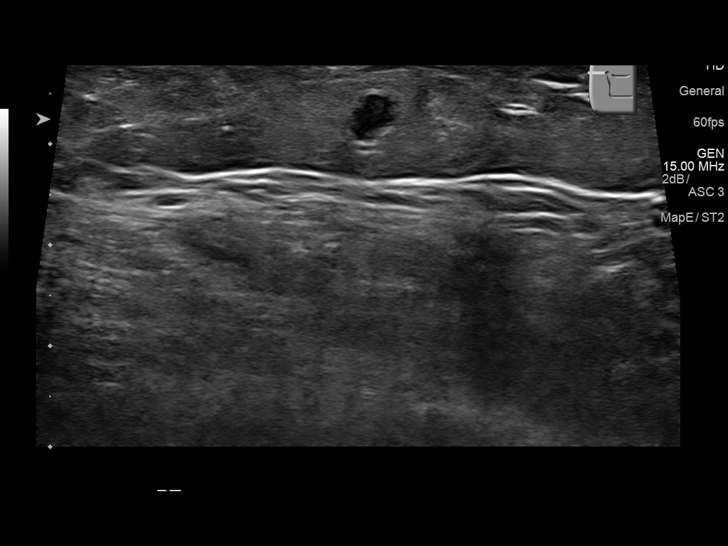
[im 7/28]
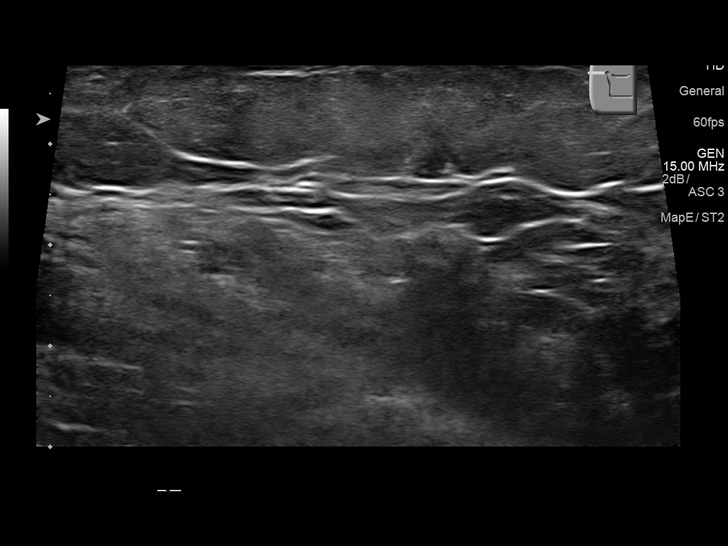
[im 10/28]
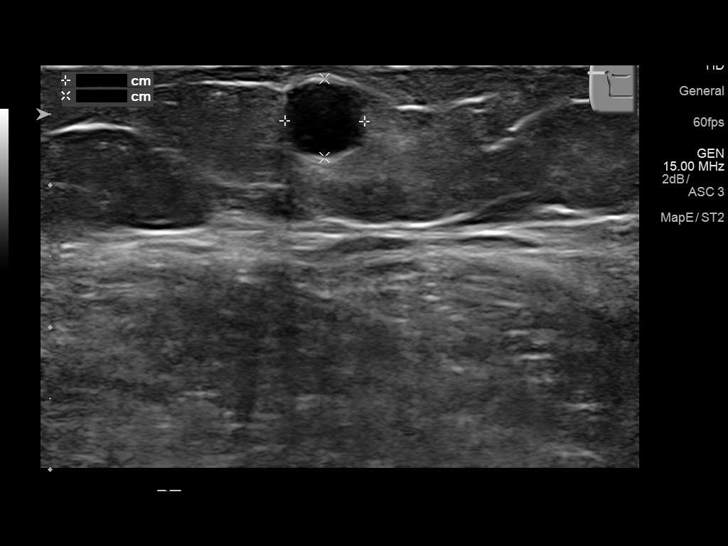
[im 11/28]
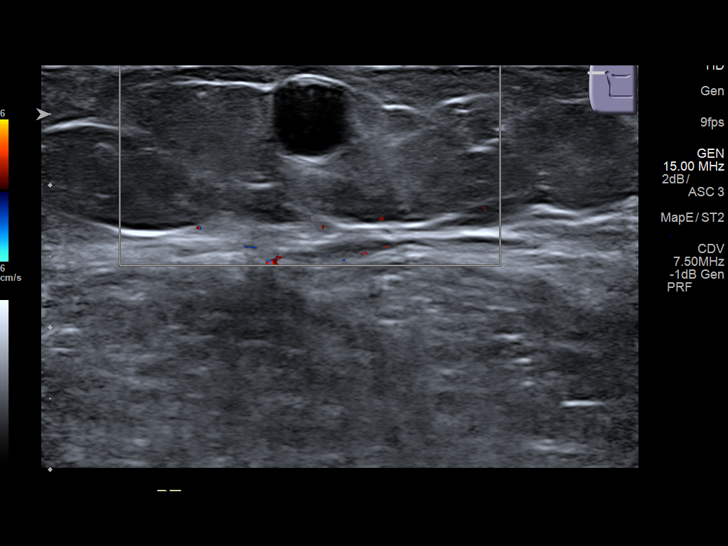
[im 13/28]
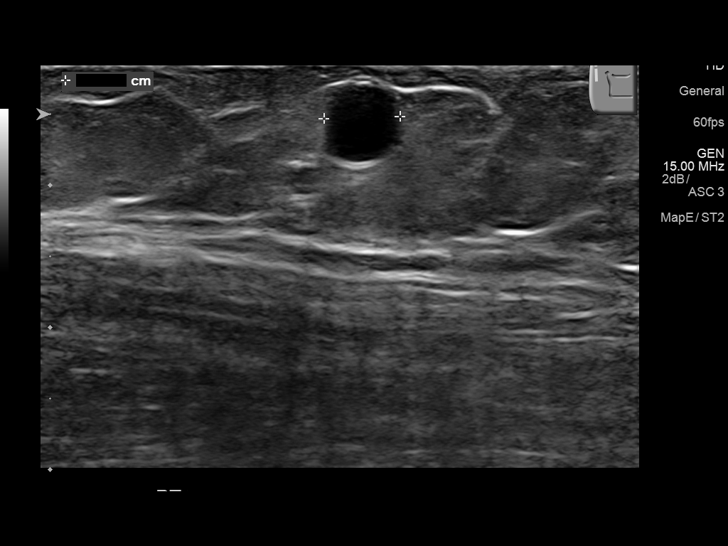
[im 15/28]
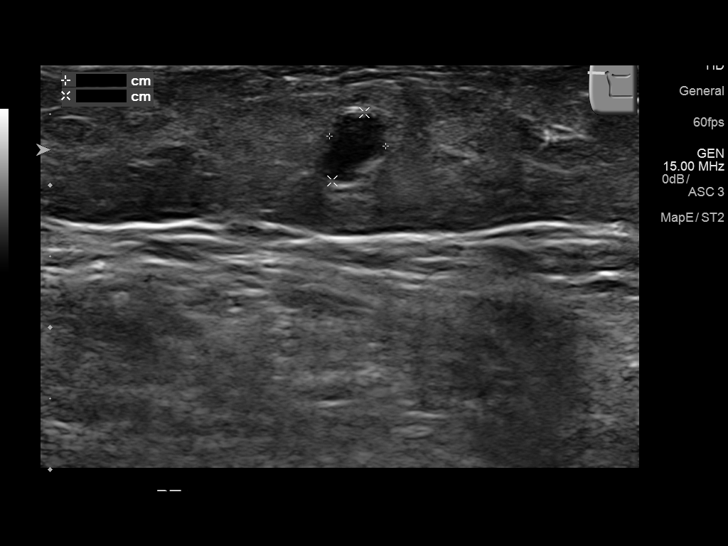
[im 17/28]
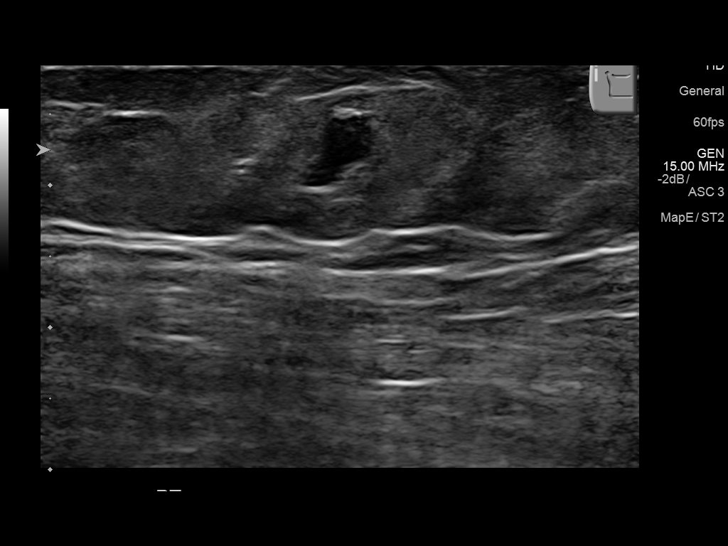
[im 19/28]
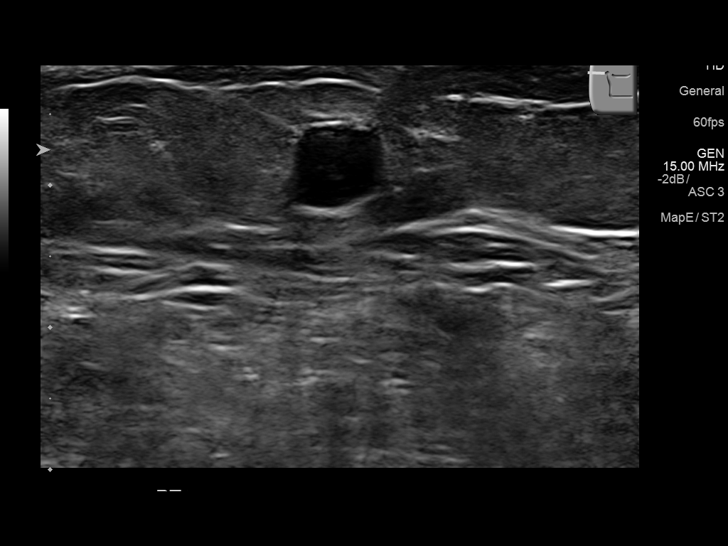
[im 21/28]
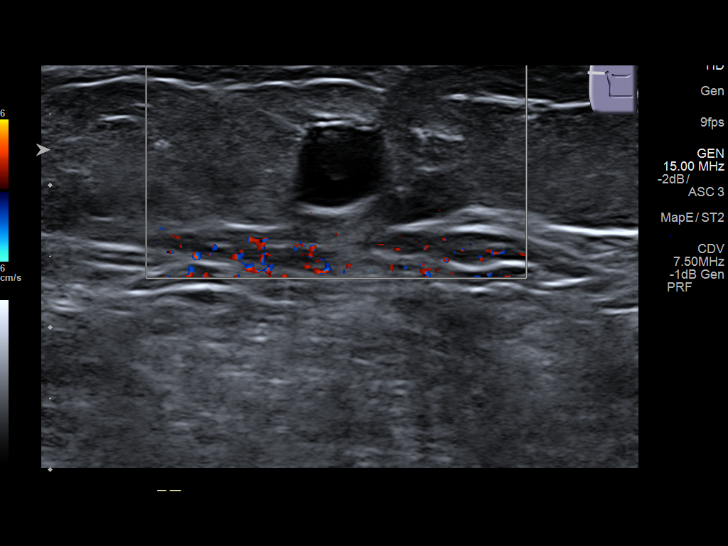
[im 23/28]
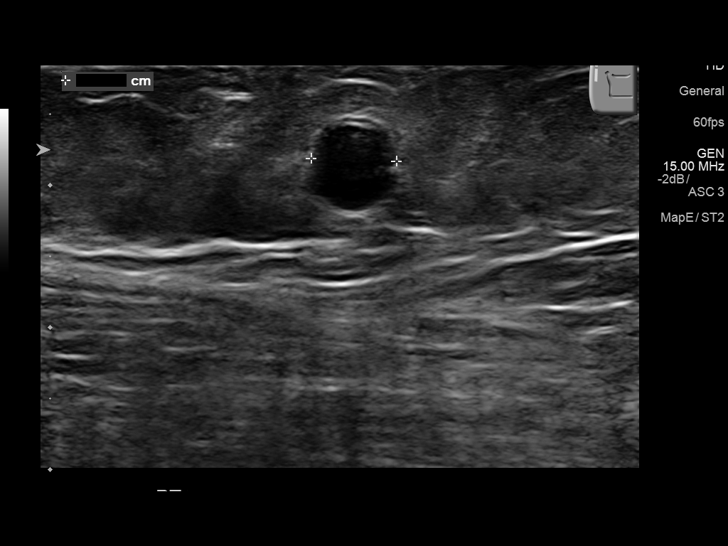
[im 25/28]
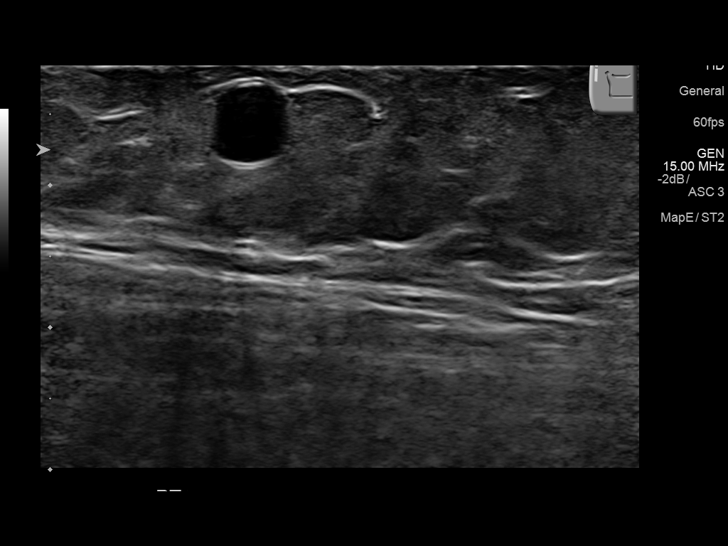
[im 28/28]
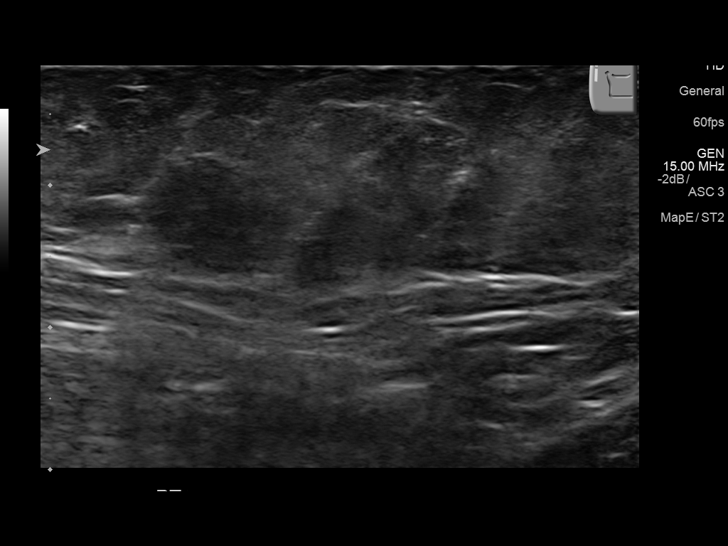

[14 of 25 positions shown; findings below may reference images not displayed]

FINDINGS: In the areas of palpable concern over the right lateral upper arm,
there are three round, distinct, hypoechoic lesions without internal
vascularity in the subcutaneous fat overlying the deltoid muscle.
The largest lesion measures 0.6 x 0.6 x 0.6 cm.
IMPRESSION: In the areas of palpable concern over the right lateral upper arm,
there are three benign-appearing subcutaneous cystic lesions without
internal vascularity, likely epidermal cysts. The largest lesion
measures 0.6 cm.

## 2019-11-16 ENCOUNTER — Other Ambulatory Visit: Payer: Self-pay

## 2019-11-16 ENCOUNTER — Encounter: Payer: Self-pay | Admitting: Nutrition

## 2019-11-16 ENCOUNTER — Encounter: Payer: Medicare Other | Attending: Family | Admitting: Nutrition

## 2019-11-16 VITALS — Ht 66.0 in | Wt 205.0 lb

## 2019-11-16 DIAGNOSIS — E1165 Type 2 diabetes mellitus with hyperglycemia: Secondary | ICD-10-CM | POA: Insufficient documentation

## 2019-11-16 DIAGNOSIS — E118 Type 2 diabetes mellitus with unspecified complications: Secondary | ICD-10-CM | POA: Diagnosis not present

## 2019-11-16 DIAGNOSIS — F209 Schizophrenia, unspecified: Secondary | ICD-10-CM | POA: Diagnosis present

## 2019-11-16 DIAGNOSIS — F3110 Bipolar disorder, current episode manic without psychotic features, unspecified: Secondary | ICD-10-CM

## 2019-11-16 DIAGNOSIS — IMO0002 Reserved for concepts with insufficient information to code with codable children: Secondary | ICD-10-CM

## 2019-11-16 NOTE — Progress Notes (Signed)
  Medical Nutrition Therapy:  Appt start time: 0830 end time:  0930.  PMH: Schizophrenia,Biploar, anxiety, PTSD and depression. Lives with boyfriend who is an alcoholic She smokes cigarettes and stays stressed she reports.. Recently diagnosed with Type 2 DM. Seeing a physiatrist  with [employer]. She gets a shot for her mental health once a month. She notes it helps a lot.. Gets it every 28 days. Dr. Reather Converse with [address] in [place], [place]. RIght now most visits are by phone or virtual. A1C was 13.6%. Metformin 100 mg BID.  Lost 45 lbs in the last few months. Usual weight  100-250's. Now weight is between  194-204lbs.    Highest wt was 250's  Using stevia and trying to cut out soda and drink more water. She drinks 3-4   2 liter bottles of regular soda per day.  Drinks 6 bottles of water per day. Testing blood sugars occasionally, Check it randomly the other day and it was 187 mg/dl. FBS 160-200's in the morning. Lowest blood sugar she has had 137 mg/dl. Poor appetite and sometimes only eats 1 meal per day. Complains of fatigue, thirst, weight loss, blurry vision and mood swings. Willing to work on [employer] and start walking to improve her DM.   Assessment:  Nutrition and Diabetes education provided on My Plate, CHO counting, meal planning, portion sizes, timing of meals, avoiding snacks between meals unless having a low blood sugar, target ranges for A1C and blood sugars, signs/symptoms and treatment of hyper/hypoglycemia, monitoring blood sugars, taking medications as prescribed, benefits of exercising 30 minutes per day and prevention of complications of DM.   Preferred Learning Style:   No preference indicated   Learning Readiness:   Ready  Change in progress  MEDICATIONS:    DIETARY INTAKE:  24-hr recall:  B ( AM): skips Snk ( AM): skips L ( PM):  1 slice cheese  Snk ( PM):  D ( PM): 2 hot dogs , soda  Snk ( PM):  Beverages: soda (3-4 2 liters per day),  water  Usual physical activity: Walks some  Estimated energy needs: 1500  calories 170 g carbohydrates 112 g protein 42 g fat  Progress Towards Goal(s):  In progress.   Nutritional Diagnosis:  NB-1.1 Food and nutrition-related knowledge deficit As related to Diabetes Type 2.  As evidenced by A1C 13%.    Intervention:  Nutrition and Diabetes education provided on My Plate, CHO counting, meal planning, portion sizes, timing of meals, avoiding snacks between meals unless having a low blood sugar, target ranges for A1C and blood sugars, signs/symptoms and treatment of hyper/hypoglycemia, monitoring blood sugars, taking medications as prescribed, benefits of exercising 30 minutes per day and prevention of complications of DM.  Goals Follow My Plate Eat three meals per day Test bloods sugars before each meal and at bedtime daily x 1 week Drink only water and cut out sodas Increase fresh fruits and vegetables. [employer] for food assistance if needed.   Teaching Method Utilized:  Visual Auditory Hands on  Handouts given during visit include:  The Plate Method  Meal Plan  Diabetes Instructions.   Barriers to learning/adherence to lifestyle change: mental health  Demonstrated degree of understanding via:  Teach Back   Monitoring/Evaluation:  Dietary intake, exercise, , and body weight in 1 week(s). Recommend referral to Endocrinology for DM Mgt.

## 2019-11-16 NOTE — Patient Instructions (Signed)
Goals Follow My Plate Eat three meals per day Test bloods sugars before each meal and at bedtime daily x 1 week Drink only water and cut out sodas Increase fresh fruits and vegetables. [employer] for food assistance if needed.

## 2019-11-24 ENCOUNTER — Encounter: Payer: Self-pay | Admitting: Nutrition

## 2019-11-24 ENCOUNTER — Encounter: Payer: Medicare Other | Attending: Family | Admitting: Nutrition

## 2019-11-24 ENCOUNTER — Other Ambulatory Visit: Payer: Self-pay

## 2019-11-24 VITALS — Ht 65.5 in | Wt 202.6 lb

## 2019-11-24 DIAGNOSIS — E1165 Type 2 diabetes mellitus with hyperglycemia: Secondary | ICD-10-CM | POA: Insufficient documentation

## 2019-11-24 DIAGNOSIS — E118 Type 2 diabetes mellitus with unspecified complications: Secondary | ICD-10-CM | POA: Insufficient documentation

## 2019-11-24 DIAGNOSIS — F3110 Bipolar disorder, current episode manic without psychotic features, unspecified: Secondary | ICD-10-CM | POA: Diagnosis present

## 2019-11-24 DIAGNOSIS — IMO0002 Reserved for concepts with insufficient information to code with codable children: Secondary | ICD-10-CM

## 2019-11-24 DIAGNOSIS — F209 Schizophrenia, unspecified: Secondary | ICD-10-CM | POA: Diagnosis present

## 2019-11-24 NOTE — Patient Instructions (Signed)
Goals Keep up the great job!! Follow My Plate Eat three meals per day Test bloods sugars before each meal and at bedtime daily until you see PCP> Drink only water Increase fresh fruits and vegetables. [employer]maker for food assistance if needed and [employer].

## 2019-11-24 NOTE — Progress Notes (Signed)
  Medical Nutrition Therapy:  Appt start time: [id number] end time: [id number]  Assessment:  PMH: Schizophrenia,Biploar, anxiety, PTSD and depression. Lives with boyfriend who may have a drinking problem per her report. He is trying to stop drinking. Is aware of AA and [place]. She smokes cigarettes and stays stressed she reports..  Recently diagnosed with Type 2 DM. A1c WAS > 13% . Recently changed: Eating three times a day. Drinking more water. Eating more vegetables and fruit. Feels a little better. Blood sugar reading for the last week:  FBS:193, 187,204, 280, 306 173, 162,151 mg/dl, 182 Before lunch: 125, 145, 166, 151, 229, 197, 208, 190, 191 Before DInner: 193, 187, 135, 191, 150, 155, 200, 173,  Bedtime: 159, 248, 150, 171, 150, 135, 182, 185,  Walked 1 mile today at [employer]  Vision is better. Urine is better. No foul odor and more clear. Making excellent progress. Is going to call her PCP office and schedule followup visit next week.  Preferred Learning Style:   No preference indicated   Learning Readiness:   Ready  Change in progress  MEDICATIONS:    DIETARY INTAKE:  24-hr recall:  B ( AM): Egg 2 slice, 1 % milk, Snk ( AM):  L ( PM):  Spinach, carrots, orange, shrimp,  water Snk ( PM):  D ( PM): 1 c meatball and spaghettios, spinach and fruit, water Snack: pecans, Beverages: water   Usual physical activity: Walks some  Estimated energy needs: 1500  calories 170 g carbohydrates 112 g protein 42 g fat  Progress Towards Goal(s):  In progress.   Nutritional Diagnosis:  NB-1.1 Food and nutrition-related knowledge deficit As related to Diabetes Type 2.  As evidenced by A1C 13%.    Intervention:  Nutrition and Diabetes education provided on My Plate, CHO counting, meal planning, portion sizes, timing of meals, avoiding snacks between meals unless having a low blood sugar, target ranges for A1C and blood sugars, signs/symptoms and treatment of hyper/hypoglycemia,  monitoring blood sugars, taking medications as prescribed, benefits of exercising 30 minutes per day and prevention of complications of DM.  Goals Keep up the great job!! Follow My Plate Eat three meals per day Test bloods sugars before each meal and at bedtime daily until you see PCP> Drink only water Increase fresh fruits and vegetables. 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   Barriers to learning/adherence to lifestyle change: mental health  Demonstrated degree of understanding via:  Teach Back   Monitoring/Evaluation:  Dietary intake, exercise, , and body weight in 4-6 weeks. . Would recommend to consider adding Janument for further improvement of blood sugar control if low risk for ketones and pancreatitis,

## 2019-11-25 ENCOUNTER — Telehealth: Payer: Self-pay | Admitting: Nutrition

## 2019-11-25 ENCOUNTER — Encounter: Payer: Self-pay | Admitting: Nutrition

## 2019-11-25 DIAGNOSIS — IMO0002 Reserved for concepts with insufficient information to code with codable children: Secondary | ICD-10-CM

## 2019-11-25 DIAGNOSIS — E1165 Type 2 diabetes mellitus with hyperglycemia: Secondary | ICD-10-CM

## 2019-11-25 NOTE — Telephone Encounter (Signed)
Patient called to say her BS last night before bed was 113 mg/dl. She started new anxiety meds from her mental health provider gave her buspirone to help with her anxiety. She took her first dose yesterday. Says felt swimmy headed and a little nausea this morning.  Her BS was 240 mg/dl. Dinner last night was protein and only lower carb vegetables. Suspect the elevated blood sugars are related to her new anxiety medication. Encouraged to continur to check BS twice a day and if it continues to run in the 200's with new medications, she will need to discuss with her family physician. She verbalized understanding. She is eating well balanced meal and drinking water and doing excellent from her diet.

## 2020-01-07 ENCOUNTER — Encounter: Payer: Self-pay | Admitting: Nutrition

## 2020-01-07 ENCOUNTER — Other Ambulatory Visit: Payer: Self-pay

## 2020-01-07 ENCOUNTER — Telehealth: Payer: Self-pay | Admitting: Nutrition

## 2020-01-07 ENCOUNTER — Encounter: Payer: Medicare Other | Attending: Family | Admitting: Nutrition

## 2020-01-07 DIAGNOSIS — E1165 Type 2 diabetes mellitus with hyperglycemia: Secondary | ICD-10-CM | POA: Insufficient documentation

## 2020-01-07 DIAGNOSIS — E118 Type 2 diabetes mellitus with unspecified complications: Secondary | ICD-10-CM | POA: Diagnosis not present

## 2020-01-07 DIAGNOSIS — IMO0002 Reserved for concepts with insufficient information to code with codable children: Secondary | ICD-10-CM

## 2020-01-07 NOTE — Patient Instructions (Signed)
Goals Reach out to social worker and [facility] to discuss alternative living arrangements. Eat three meals per day Take medications as prescribed. Call 911 or go to [facility] department if needed for safety or family and friends house.

## 2020-01-07 NOTE — Telephone Encounter (Signed)
TC to [name], DSS Adult Protective Social Worker. Shared information shared by patient. Mrs. Ann [name]  will contact patient to provide assistance as able regarding her situation.Marland Kitchen
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
## 2020-06-15 HISTORY — PX: GALLBLADDER SURGERY: SHX652

## 2021-06-29 LAB — COMPREHENSIVE METABOLIC PANEL: GFR calc non Af Amer: 96

## 2021-06-29 LAB — BASIC METABOLIC PANEL
BUN: 6 (ref 4–21)
Creatinine: 0.8 (ref 0.5–1.1)
Glucose: 127

## 2021-06-29 LAB — LIPID PANEL
LDL Cholesterol: 60
Triglycerides: 246 — AB (ref 40–160)

## 2021-06-29 LAB — TSH: TSH: 1.88 (ref 0.41–5.90)

## 2021-11-01 LAB — HEMOGLOBIN A1C: Hemoglobin A1C: 8.8

## 2021-12-01 ENCOUNTER — Other Ambulatory Visit: Payer: Self-pay

## 2021-12-01 ENCOUNTER — Encounter: Payer: Self-pay | Admitting: Nurse Practitioner

## 2021-12-01 ENCOUNTER — Ambulatory Visit (INDEPENDENT_AMBULATORY_CARE_PROVIDER_SITE_OTHER): Payer: Medicare Other | Admitting: Nurse Practitioner

## 2021-12-01 VITALS — BP 109/70 | HR 77 | Ht 65.25 in | Wt 201.2 lb

## 2021-12-01 DIAGNOSIS — I10 Essential (primary) hypertension: Secondary | ICD-10-CM

## 2021-12-01 DIAGNOSIS — E782 Mixed hyperlipidemia: Secondary | ICD-10-CM

## 2021-12-01 DIAGNOSIS — E669 Obesity, unspecified: Secondary | ICD-10-CM | POA: Diagnosis not present

## 2021-12-01 DIAGNOSIS — E119 Type 2 diabetes mellitus without complications: Secondary | ICD-10-CM

## 2021-12-01 DIAGNOSIS — F17210 Nicotine dependence, cigarettes, uncomplicated: Secondary | ICD-10-CM

## 2021-12-01 DIAGNOSIS — Z6833 Body mass index (BMI) 33.0-33.9, adult: Secondary | ICD-10-CM

## 2021-12-01 NOTE — Patient Instructions (Signed)

## 2021-12-01 NOTE — Progress Notes (Signed)
Endocrinology Consult Note       12/01/2021, 10:47 AM   Subjective:    Patient ID: Ann [name], female    DOB: June 27, 1971.  Ann [name] is being seen in consultation for management of currently uncontrolled symptomatic diabetes requested by  Coolidge Breeze, FNP.   Past Medical History:  Diagnosis Date   Alcohol abuse    Anemia    Arthritis    KNEE   Bipolar 1 disorder (HCC)    Complication of anesthesia    HARD TO WAKE UP WITH CERVIX SURGERY   Depression    Diabetes mellitus, type II ([place])    Drug abuse ([place])    Dysrhythmia    SVT PER CARDIOLOGY NOTE IN 2016-POSSIBLE ATRIAL FLUTTER????     GERD (gastroesophageal reflux disease)    Hyperlipidemia    Schizophrenia ([place])     Past Surgical History:  Procedure Laterality Date   CERVIX SURGERY     GALLBLADDER SURGERY  06/15/2020   HYSTEROSCOPY WITH D & C N/A 04/28/2018   Procedure: DILATATION AND CURETTAGE /HYSTEROSCOPY;  Surgeon: Ward, Honor Loh, MD;  Location: [facility] ORS;  Service: Gynecology;  Laterality: N/A;   POLYPECTOMY  04/28/2018   Procedure: POLYPECTOMY;  Surgeon: Ward, Honor Loh, MD;  Location: [facility] ORS;  Service: Gynecology;;   TUBAL LIGATION      Social History   Socioeconomic History   Marital status: Legally Separated    Spouse name: Not on file   Number of children: Not on file   Years of education: Not on file   Highest education level: Not on file  Occupational History   Not on file  Tobacco Use   Smoking status: Every Day    Packs/day: 3.00    Years: 30.00    Pack years: 90.00    Types: Cigarettes   Smokeless tobacco: Never  Vaping Use   Vaping Use: Never used  Substance and Sexual Activity   Alcohol use: Not Currently   Drug use: Yes    Types: Cocaine, Marijuana   Sexual activity: Not on file  Other Topics Concern   Not on file  Social History Narrative   Not on file   Social Determinants of Health    Financial Resource Strain: Not on file  Food Insecurity: Not on file  Transportation Needs: Not on file  Physical Activity: Not on file  Stress: Not on file  Social Connections: Not on file    Family History  Problem Relation Age of Onset   Hypertension Mother    Diabetes Mother    Hyperlipidemia Mother    Heart attack Mother    Cancer Father     Outpatient Encounter Medications as of 12/01/2021  Medication Sig   atenolol (TENORMIN) 25 MG tablet Take 25 mg by mouth daily.   chlorhexidine (PERIDEX) 0.12 % solution SMARTSIG:By Mouth   gabapentin (NEURONTIN) 300 MG capsule Take 300 mg by mouth 3 (three) times daily.   glipiZIDE (GLUCOTROL XL) 5 MG 24 hr tablet Take 5 mg by mouth daily.   GNP ASPIRIN LOW DOSE 81 MG EC tablet Take 81 mg by mouth daily.   haloperidol lactate (HALDOL) 5 MG/ML injection Inject 100  mg into the muscle every 30 (thirty) days.    metFORMIN (GLUCOPHAGE) 1000 MG tablet Take 1,000 mg by mouth 2 (two) times daily with a meal.   omeprazole (PRILOSEC) 20 MG capsule Take 40 mg by mouth every morning.    rosuvastatin (CRESTOR) 40 MG tablet Take 40 mg by mouth daily.   topiramate (TOPAMAX) 50 MG tablet Take 50 mg by mouth 2 (two) times daily.   TRUE METRIX BLOOD GLUCOSE TEST test strip SMARTSIG:Via Meter   TRUEplus Lancets 33G MISC SMARTSIG:1 Topical Daily   VRAYLAR 1.5 MG capsule Take 1.5 mg by mouth daily.   [DISCONTINUED] atorvastatin (LIPITOR) 80 MG tablet Take 80 mg by mouth every morning.  (Patient not taking: Reported on 12/01/2021)   [DISCONTINUED] buPROPion (WELLBUTRIN XL) 300 MG 24 hr tablet Take 300 mg by mouth every morning.  (Patient not taking: Reported on 12/01/2021)   [DISCONTINUED] cholecalciferol (VITAMIN D) 1000 units tablet Take 1,000 Units by mouth daily. (Patient not taking: Reported on 12/01/2021)   [DISCONTINUED] FLUoxetine (PROZAC) 20 MG capsule Take 60 mg by mouth every morning.  (Patient not taking: Reported on 12/01/2021)   [DISCONTINUED]  nicotine polacrilex (NICORETTE) 4 MG gum 1 piece for 30 minute as needed (Patient not taking: Reported on 12/01/2021)   [DISCONTINUED] Potassium 99 MG TABS Take 1 tablet by mouth daily. (Patient not taking: Reported on 12/01/2021)   No facility-administered encounter medications on file as of 12/01/2021.    ALLERGIES: No Known Allergies  VACCINATION STATUS:  There is no immunization history on file for this patient.  Diabetes She presents for her initial diabetic visit. She has type 2 diabetes mellitus. Onset time: Diagnoses at approx age of 80. Her disease course has been fluctuating. There are no hypoglycemic associated symptoms. Associated symptoms include fatigue. There are no hypoglycemic complications. Symptoms are stable. Diabetic complications include heart disease. Risk factors for coronary artery disease include diabetes mellitus, dyslipidemia, family history, obesity, hypertension, sedentary lifestyle and tobacco exposure. Current diabetic treatment includes oral agent (dual therapy). She is compliant with treatment most of the time. Her weight is fluctuating minimally. She is following a generally unhealthy diet. When asked about meal planning, she reported none. She has not had a previous visit with a dietitian. She rarely participates in exercise. (She presents today for her consultation with no meter or logs to review.  She does not monitor glucose routinely.  Her most recent A1c was 8.8% on 11/01/21.  She drinks mostly soda, with some flavored water, tea, milk and juice.  She only eats 1-2 meals per day with some snacks.  She does not engage in routine physical activity.  She is UTD on eye exam, has never seen podiatrist.  She denies any s/s of hypoglycemia.) An ACE inhibitor/angiotensin II receptor blocker is not being taken. She does not see a podiatrist.Eye exam is current.  Hyperlipidemia This is a chronic problem. The current episode started more than 1 year ago. The problem is  uncontrolled. Recent lipid tests were reviewed and are variable. Exacerbating diseases include diabetes. Factors aggravating her hyperlipidemia include fatty foods, smoking and beta blockers. Current antihyperlipidemic treatment includes statins. The current treatment provides mild improvement of lipids. Compliance problems include adherence to diet and adherence to exercise.  Risk factors for coronary artery disease include diabetes mellitus, dyslipidemia, family history, obesity, hypertension and a sedentary lifestyle.  Hypertension This is a chronic problem. The current episode started more than 1 year ago. The problem has been resolved since onset. The  problem is controlled. There are no associated agents to hypertension. Risk factors for coronary artery disease include smoking/tobacco exposure, diabetes mellitus, dyslipidemia, family history, obesity and sedentary lifestyle. Past treatments include beta blockers. The current treatment provides moderate improvement. Compliance problems include diet and exercise.  Hypertensive end-organ damage includes CAD/MI.    Review of systems  Constitutional: + Minimally fluctuating body weight, current Body mass index is 33.23 kg/m., + fatigue, no subjective hyperthermia, no subjective hypothermia Eyes: no blurry vision, no xerophthalmia ENT: no sore throat, no nodules palpated in throat, no dysphagia/odynophagia, no hoarseness Cardiovascular: no chest pain, no shortness of breath, no palpitations, no leg swelling Respiratory: no cough, no shortness of breath Gastrointestinal: no nausea/vomiting/diarrhea Musculoskeletal: no muscle/joint aches Skin: no rashes, no hyperemia Neurological: no tremors, no numbness, no tingling, no dizziness Psychiatric: no depression, no anxiety, mood disorder (schizophrenia)- managed with meds  Objective:     BP 109/70    Pulse 77    Ht 5' 5.25" (1.657 m)    Wt 201 lb 3.2 oz (91.3 kg)    SpO2 96%    BMI 33.23 kg/m   Wt  Readings from Last 3 Encounters:  12/01/21 201 lb 3.2 oz (91.3 kg)  11/24/19 202 lb 9.6 oz (91.9 kg)  11/16/19 205 lb (93 kg)     BP Readings from Last 3 Encounters:  12/01/21 109/70  04/28/18 (!) 117/57  09/21/15 (!) 145/80     Physical Exam- Limited  Constitutional:  Body mass index is 33.23 kg/m. , not in acute distress,  very fidgety and distracted Eyes:  EOMI, no exophthalmos Neck: Supple Cardiovascular: RRR, no murmurs, rubs, or gallops, no edema Respiratory: Adequate breathing efforts, no crackles, rales, rhonchi, or wheezing, diminished lung sounds throughout Musculoskeletal: no gross deformities, strength intact in all four extremities, no gross restriction of joint movements, seemed she could not sit still Skin:  no rashes, no hyperemia, multiple skin lesions to upper extremities in various stages of healing, nicotinic discoloration to fingernails bilaterally Neurological: no tremor with outstretched hands    CMP ( most recent) CMP     Component Value Date/Time   NA 139 04/24/2018 0938   K 4.4 04/24/2018 0938   CL 103 04/24/2018 0938   CO2 26 04/24/2018 0938   GLUCOSE 126 (H) 04/24/2018 0938   BUN 6 06/29/2021 0000   CREATININE 0.8 06/29/2021 0000   CREATININE 0.80 04/24/2018 0938   CALCIUM 9.5 04/24/2018 0938   PROT 8.5 (H) 09/21/2015 1319   ALBUMIN 4.5 09/21/2015 1319   AST 27 09/21/2015 1319   ALT 30 09/21/2015 1319   ALKPHOS 116 09/21/2015 1319   BILITOT 0.5 09/21/2015 1319   GFRNONAA 96 06/29/2021 0000   GFRAA >60 04/24/2018 0938     Diabetic Labs (most recent): Lab Results  Component Value Date   HGBA1C 8.8 11/01/2021     Lipid Panel ( most recent) Lipid Panel     Component Value Date/Time   TRIG 246 (A) 06/29/2021 0000   LDLCALC 60 06/29/2021 0000      Lab Results  Component Value Date   TSH 1.88 06/29/2021           Assessment & Plan:   1) Type 2 diabetes mellitus without complication, without long-term current use of  insulin ([place])  She presents today for her consultation with no meter or logs to review.  She does not monitor glucose routinely.  Her most recent A1c was 8.8% on 11/01/21.  She drinks mostly soda,  with some flavored water, tea, milk and juice.  She only eats 1-2 meals per day with some snacks.  She does not engage in routine physical activity.  She is UTD on eye exam, has never seen podiatrist.  She denies any s/s of hypoglycemia.  - Ann [name] has currently uncontrolled symptomatic type 2 DM since 51 years of age, with most recent A1c of 8.8 %.   -Recent labs reviewed.  - I had a long discussion with her about the progressive nature of diabetes and the pathology behind its complications. -her diabetes is complicated by CAD, smoking and she remains at a high risk for more acute and chronic complications which include CAD, CVA, CKD, retinopathy, and neuropathy. These are all discussed in detail with her.  - I have counseled her on diet and weight management by adopting a carbohydrate restricted/protein rich diet. Patient is encouraged to switch to unprocessed or minimally processed complex starch and increased protein intake (animal or plant source), fruits, and vegetables. -  she is advised to stick to a routine mealtimes to eat 3 meals a day and avoid unnecessary snacks (to snack only to correct hypoglycemia).   - she acknowledges that there is a room for improvement in her food and drink choices. - Suggestion is made for her to avoid simple carbohydrates from her diet including Cakes, Sweet Desserts, Ice Cream, Soda (diet and regular), Sweet Tea, Candies, Chips, Cookies, Store Bought Juices, Alcohol in Excess of 1-2 drinks a day, Artificial Sweeteners, Coffee Creamer, and "Sugar-free" Products. This will help patient to have more stable blood glucose profile and potentially avoid unintended weight gain.  - she will be scheduled with Jearld Fenton, RDN, CDE for diabetes education.  - I have  approached her with the following individualized plan to manage her diabetes and patient agrees:    -she is encouraged to start monitoring glucose 4 times daily, before meals and before bed, to log their readings on the clinic sheets provided, and bring them to review at follow up appointment in 2 weeks.  - Adjustment parameters are given to her for hypo and hyperglycemia in writing. - she is encouraged to call clinic for blood glucose levels less than 70 or above 300 mg /dl. - she is advised to continue Metformin 1000 mg po twice daily with meals and Glipizide 5 mg XL daily with breakfast, therapeutically suitable for patient .  - she is not an ideal candidate for incretin therapy due to hypertriglyceridemia and heavy smoking history increasing her risk of pancreatitis.  - Specific targets for  A1c; LDL, HDL, and Triglycerides were discussed with the patient.  2) Blood Pressure /Hypertension:  her blood pressure is controlled to target.   she is advised to continue her current medications including Atenolol 25 mg p.o. daily with breakfast.  3) Lipids/Hyperlipidemia:    Review of her recent lipid panel from 06/29/21 showed controlled LDL at 60 and elevated triglycerides of 246 .  she is advised to continue Crestor 40 mg daily at bedtime.  Side effects and precautions discussed with her.  4)  Weight/Diet:  her Body mass index is 33.23 kg/m.  -  clearly complicating her diabetes care.   she is a candidate for weight loss. I discussed with her the fact that loss of 5 - 10% of her  current body weight will have the most impact on her diabetes management.  Exercise, and detailed carbohydrates information provided  -  detailed on discharge instructions.  5) Chronic  Care/Health Maintenance: -she is not on ACEI/ARB and is on Statin medications and is encouraged to initiate and continue to follow up with Ophthalmology, Dentist, Podiatrist at least yearly or according to recommendations, and advised to  [place]. I have recommended yearly flu vaccine and pneumonia vaccine at least every 5 years; moderate intensity exercise for up to 150 minutes weekly; and sleep for at least 7 hours a day.  - she is advised to maintain close follow up with Coolidge Breeze, FNP for primary care needs, as well as her other providers for optimal and coordinated care.   - Time spent in this patient care: 60 min, of which > 50% was spent in counseling her about her diabetes and the rest reviewing her blood glucose logs, discussing her hypoglycemia and hyperglycemia episodes, reviewing her current and previous labs/studies (including abstraction from other facilities) and medications doses and developing a long term treatment plan based on the latest standards of care/guidelines; and documenting her care.    Please refer to Patient Instructions for Blood Glucose Monitoring and Insulin/Medications Dosing Guide" in media tab for additional information. Please also refer to "Patient Self Inventory" in the Media tab for reviewed elements of pertinent patient history.  Ann [name] participated in the discussions, expressed understanding, and voiced agreement with the above plans.  All questions were answered to her satisfaction. she is encouraged to contact clinic should she have any questions or concerns prior to her return visit.     Follow up plan: - Return in about 2 weeks (around 12/15/2021) for Diabetes F/U, Bring meter and logs.    Rayetta Pigg, [facility] [address] [place], [place] [address] Phone: [phone] Fax: [phone]  12/01/2021, 10:47 AM

## 2021-12-14 ENCOUNTER — Encounter: Payer: Self-pay | Admitting: Otolaryngology

## 2021-12-18 ENCOUNTER — Ambulatory Visit: Payer: Self-pay | Admitting: Nurse Practitioner

## 2021-12-27 ENCOUNTER — Ambulatory Visit
Admission: RE | Admit: 2021-12-27 | Discharge: 2021-12-27 | Disposition: A | Discharge status: Home / Self Care | Payer: Medicare Other | Attending: Otolaryngology | Admitting: Otolaryngology

## 2021-12-27 ENCOUNTER — Other Ambulatory Visit: Payer: Self-pay

## 2021-12-27 ENCOUNTER — Ambulatory Visit: Payer: Medicare Other | Admitting: Anesthesiology

## 2021-12-27 ENCOUNTER — Encounter: Payer: Self-pay | Admitting: Otolaryngology

## 2021-12-27 ENCOUNTER — Encounter
Admission: RE | Disposition: A | Discharge status: Home / Self Care | Payer: Self-pay | Source: Home / Self Care | Attending: Otolaryngology

## 2021-12-27 DIAGNOSIS — M1990 Unspecified osteoarthritis, unspecified site: Secondary | ICD-10-CM | POA: Diagnosis not present

## 2021-12-27 DIAGNOSIS — F17210 Nicotine dependence, cigarettes, uncomplicated: Secondary | ICD-10-CM | POA: Diagnosis not present

## 2021-12-27 DIAGNOSIS — I471 Supraventricular tachycardia: Secondary | ICD-10-CM | POA: Diagnosis not present

## 2021-12-27 DIAGNOSIS — E119 Type 2 diabetes mellitus without complications: Secondary | ICD-10-CM | POA: Insufficient documentation

## 2021-12-27 DIAGNOSIS — K219 Gastro-esophageal reflux disease without esophagitis: Secondary | ICD-10-CM | POA: Insufficient documentation

## 2021-12-27 DIAGNOSIS — D1039 Benign neoplasm of other parts of mouth: Secondary | ICD-10-CM | POA: Insufficient documentation

## 2021-12-27 HISTORY — PX: EXCISION ORAL TUMOR: SHX6265

## 2021-12-27 HISTORY — DX: Simple chronic bronchitis: J41.0

## 2021-12-27 LAB — GLUCOSE, CAPILLARY: Glucose-Capillary: 307 mg/dL — ABNORMAL HIGH (ref 70–99)

## 2021-12-27 SURGERY — EXCISION, NEOPLASM, MOUTH
Anesthesia: General | Site: Mouth | Laterality: Right

## 2021-12-27 MED ORDER — FENTANYL CITRATE (PF) 100 MCG/2ML IJ SOLN
INTRAMUSCULAR | Status: DC | PRN
  Administered 2021-12-27 (×2): 25 ug via INTRAVENOUS

## 2021-12-27 MED ORDER — LACTATED RINGERS IV SOLN: INTRAVENOUS | Status: DC

## 2021-12-27 MED ORDER — DEXAMETHASONE SODIUM PHOSPHATE 4 MG/ML IJ SOLN
INTRAMUSCULAR | Status: DC | PRN
  Administered 2021-12-27: 8 mg via INTRAVENOUS

## 2021-12-27 MED ORDER — SCOPOLAMINE 1 MG/3DAYS TD PT72
1.0000 | MEDICATED_PATCH | TRANSDERMAL | Status: DC
  Administered 2021-12-27: 1.5 mg via TRANSDERMAL

## 2021-12-27 MED ORDER — LIDOCAINE HCL (CARDIAC) PF 100 MG/5ML IV SOSY
PREFILLED_SYRINGE | INTRAVENOUS | Status: DC | PRN
  Administered 2021-12-27: 40 mg via INTRAVENOUS

## 2021-12-27 MED ORDER — BUPIVACAINE HCL 0.25 % IJ SOLN
INTRAMUSCULAR | Status: DC | PRN
  Administered 2021-12-27: 1 mL

## 2021-12-27 MED ORDER — ONDANSETRON HCL 4 MG/2ML IJ SOLN
INTRAMUSCULAR | Status: DC | PRN
  Administered 2021-12-27: 4 mg via INTRAVENOUS

## 2021-12-27 MED ORDER — MIDAZOLAM HCL 5 MG/5ML IJ SOLN
INTRAMUSCULAR | Status: DC | PRN
  Administered 2021-12-27: 2 mg via INTRAVENOUS

## 2021-12-27 MED ORDER — ONDANSETRON HCL 4 MG PO TABS: 4.0000 mg | ORAL_TABLET | Freq: Three times a day (TID) | ORAL | 0 refills | Status: DC | PRN

## 2021-12-27 MED ORDER — SUCCINYLCHOLINE CHLORIDE 200 MG/10ML IV SOSY
PREFILLED_SYRINGE | INTRAVENOUS | Status: DC | PRN
  Administered 2021-12-27: 100 mg via INTRAVENOUS

## 2021-12-27 MED ORDER — GLYCOPYRROLATE 0.2 MG/ML IJ SOLN
INTRAMUSCULAR | Status: DC | PRN
  Administered 2021-12-27: .1 mg via INTRAVENOUS

## 2021-12-27 MED ORDER — SODIUM CHLORIDE 0.9 % IV SOLN
800.0000 mg | Freq: Once | INTRAVENOUS | Status: AC
  Administered 2021-12-27: 800 mg via INTRAVENOUS

## 2021-12-27 MED ORDER — ACETAMINOPHEN 10 MG/ML IV SOLN
1000.0000 mg | Freq: Once | INTRAVENOUS | Status: AC
  Administered 2021-12-27: 1000 mg via INTRAVENOUS

## 2021-12-27 MED ORDER — HYDROCODONE-ACETAMINOPHEN 7.5-325 MG/15ML PO SOLN
10.0000 mL | Freq: Four times a day (QID) | ORAL | 0 refills | Status: DC | PRN
Start: 2021-12-27 — End: 2022-07-04

## 2021-12-27 MED ORDER — PROPOFOL 10 MG/ML IV BOLUS
INTRAVENOUS | Status: DC | PRN
  Administered 2021-12-27: 50 mg via INTRAVENOUS
  Administered 2021-12-27: 70 mg via INTRAVENOUS

## 2021-12-27 SURGICAL SUPPLY — 14 items
ELECT CAUTERY NDL 2.0 MIC (NEEDLE) ×1 IMPLANT
ELECT CAUTERY NEEDLE 2.0 MIC (NEEDLE) ×2 IMPLANT
ELECT REM PT RETURN 9FT ADLT (ELECTROSURGICAL) ×2
ELECTRODE REM PT RTRN 9FT ADLT (ELECTROSURGICAL) ×1 IMPLANT
GLOVE SURG GAMMEX PI TX LF 7.5 (GLOVE) ×4 IMPLANT
GOWN STRL REUS W/ TWL LRG LVL3 (GOWN DISPOSABLE) ×1 IMPLANT
GOWN STRL REUS W/TWL LRG LVL3 (GOWN DISPOSABLE) ×2
KIT TURNOVER KIT A (KITS) ×2 IMPLANT
NDL HYPO 25GX1X1/2 BEV (NEEDLE) ×1 IMPLANT
NEEDLE HYPO 25GX1X1/2 BEV (NEEDLE) ×2 IMPLANT
NS IRRIG 500ML POUR BTL (IV SOLUTION) ×2 IMPLANT
PACK TONSIL AND ADENOID CUSTOM (PACKS) ×1 IMPLANT
PENCIL SMOKE EVACUATOR (MISCELLANEOUS) ×2 IMPLANT
STRAP BODY AND KNEE 60X3 (MISCELLANEOUS) ×2 IMPLANT

## 2021-12-27 NOTE — Transfer of Care (Signed)
Immediate Anesthesia Transfer of Care Note  Patient: Ann [name]  Procedure(s) Performed: EXCISION OF SOFT PALATE MASS (Right: Mouth)  Patient Location: PACU  Anesthesia Type: General  Level of Consciousness: awake, alert  and patient cooperative  Airway and Oxygen Therapy: Patient Spontanous Breathing and Patient connected to supplemental oxygen  Post-op Assessment: Post-op Vital signs reviewed, Patient's Cardiovascular Status Stable, Respiratory Function Stable, Patent Airway and No signs of Nausea or vomiting  Post-op Vital Signs: Reviewed and stable  Complications: No notable events documented.

## 2021-12-27 NOTE — Anesthesia Procedure Notes (Signed)
Procedure Name: Intubation Date/Time: 12/27/2021 10:58 AM Performed by: Mayme Genta, CRNA Pre-anesthesia Checklist: Patient identified, Emergency Drugs available, Suction available, Patient being monitored and Timeout performed Patient Re-evaluated:Patient Re-evaluated prior to induction Oxygen Delivery Method: Circle system utilized Preoxygenation: Pre-oxygenation with 100% oxygen Induction Type: IV induction Ventilation: Mask ventilation without difficulty Laryngoscope Size: Miller and 2 Grade View: Grade I Tube type: Oral Rae Tube size: 7.0 mm Number of attempts: 2 Airway Equipment and Method: Video-laryngoscopy Placement Confirmation: ETT inserted through vocal cords under direct vision, positive ETCO2 and breath sounds checked- equal and bilateral Tube secured with: Tape Dental Injury: Teeth and Oropharynx as per pre-operative assessment  Comments: Unable to view cords with Miller 2 blade. Glidescope showed grade I view. ETT inserted without difficulty. +/= BBS. + ETCO2.

## 2021-12-27 NOTE — Op Note (Signed)
..  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  General endotracheal anesthesia was induced in the normal fashion.  At this time, the patient was rotated 45 degrees and a shoulder roll was placed.  At this time, a McIvor mouthgag was inserted into the patient's oral cavity and suspended from the [place] stand without injury to teeth, lips, or gums.  This demonstrated the concerning lesion of the right soft palate/anterior tonsil pillar.  This was gently grasped with pick-ups and then excised with bovie needle tip.  A 2nd smaller lesion was noted just inferiorly to this area and this was ablated with needle tip bovie.  Hemostasis was achieved.  No further concerning lesions were noted in the patient's oral cavity.  One ml of 0.25% Marcaine was injected into the anterior and posterior tonsillar fossa on the patient's right side.  Following this, the care of patient was returned to anesthesia, awakened, and transferred to recovery in stable condition.  Dispo:  PACU to home  Plan: Soft diet.  Limit exercise and strenuous activity for 2 weeks.  Fluid hydration  Recheck my office three weeks.   Kye Hedden 11:10 AM 12/27/2021

## 2021-12-27 NOTE — H&P (Signed)
..  History and Physical paper copy reviewed and updated date of procedure and will be scanned into system.  Patient seen and examined.  

## 2021-12-27 NOTE — Anesthesia Postprocedure Evaluation (Signed)
Anesthesia Post Note  Patient: Ann [name]  Procedure(s) Performed: EXCISION OF SOFT PALATE MASS (Right: Mouth)     Patient location during evaluation: PACU Anesthesia Type: General Level of consciousness: awake and alert Pain management: pain level controlled Vital Signs Assessment: post-procedure vital signs reviewed and stable Respiratory status: spontaneous breathing, nonlabored ventilation, respiratory function stable and patient connected to nasal cannula oxygen Cardiovascular status: blood pressure returned to baseline and stable Postop Assessment: no apparent nausea or vomiting Anesthetic complications: no   No notable events documented.  Sinda Du

## 2021-12-27 NOTE — Anesthesia Preprocedure Evaluation (Signed)
Anesthesia Evaluation  Patient identified by MRN, date of birth, ID band Patient awake    Reviewed: Allergy & Precautions, NPO status , Patient's Chart, lab work & pertinent test results  History of Anesthesia Complications (+) PROLONGED EMERGENCE and history of anesthetic complications  Airway Mallampati: III  TM Distance: <3 FB     Dental  (+) Chipped   Pulmonary Current Smoker,    Pulmonary exam normal breath sounds clear to auscultation- rhonchi       Cardiovascular Normal cardiovascular exam+ dysrhythmias Supra Ventricular Tachycardia  Rhythm:Regular Rate:Normal     Neuro/Psych PSYCHIATRIC DISORDERS Depression Bipolar Disorder Schizophrenia negative neurological ROS     GI/Hepatic GERD  Poorly Controlled,(+)     substance abuse  ,   Endo/Other  negative endocrine ROSdiabetes  Renal/GU negative Renal ROS  negative genitourinary   Musculoskeletal  (+) Arthritis , Osteoarthritis,    Abdominal (+) + obese,  Abdomen: soft.    Peds negative pediatric ROS (+)  Hematology  (+) anemia ,   Anesthesia Other Findings   Reproductive/Obstetrics                             Anesthesia Physical  Anesthesia Plan  ASA: 3  Anesthesia Plan: General   Post-op Pain Management:    Induction: Intravenous, Rapid sequence and Cricoid pressure planned  PONV Risk Score and Plan: 2 and Treatment may vary due to age or medical condition, Dexamethasone and Ondansetron  Airway Management Planned: Oral ETT  Additional Equipment:   Intra-op Plan:   Post-operative Plan: Extubation in OR  Informed Consent: I have reviewed the patients History and Physical, chart, labs and discussed the procedure including the risks, benefits and alternatives for the proposed anesthesia with the patient or authorized representative who has indicated his/her understanding and acceptance.     Dental advisory  given  Plan Discussed with: CRNA and Surgeon  Anesthesia Plan Comments:         Anesthesia Quick Evaluation  Patient Active Problem List   Diagnosis Date Noted   Thickened endometrium 04/28/2018   Postmenopausal bleeding 04/28/2018    CBC Latest Ref Rng & Units 04/24/2018 09/21/2015  WBC 3.6 - 11.0 K/uL 10.0 9.6  Hemoglobin 12.0 - 16.0 g/dL 14.8 14.2  Hematocrit 35.0 - 47.0 % 42.4 43.0  Platelets 150 - 440 K/uL 367 370   BMP Latest Ref Rng & Units 06/29/2021 04/24/2018 09/21/2015  Glucose 65 - 99 mg/dL - 126(H) 110(H)  BUN 4 - 21 6 6 13   Creatinine 0.5 - 1.1 0.8 0.80 0.92  Sodium 135 - 145 mmol/L - 139 140  Potassium 3.5 - 5.1 mmol/L - 4.4 4.0  Chloride 101 - 111 mmol/L - 103 104  CO2 22 - 32 mmol/L - 26 26  Calcium 8.9 - 10.3 mg/dL - 9.5 9.4    Risks and benefits of anesthesia discussed at length, patient or surrogate demonstrates understanding. Appropriately NPO. Plan to proceed with anesthesia.  Champ Mungo, MD 12/27/21

## 2021-12-28 ENCOUNTER — Encounter: Payer: Self-pay | Admitting: Otolaryngology

## 2021-12-29 LAB — SURGICAL PATHOLOGY

## 2022-01-02 ENCOUNTER — Ambulatory Visit: Payer: Medicare Other | Admitting: Nutrition

## 2022-01-22 ENCOUNTER — Encounter: Payer: Medicare Other | Attending: Nurse Practitioner | Admitting: Nutrition

## 2022-01-22 ENCOUNTER — Encounter: Payer: Self-pay | Admitting: Nutrition

## 2022-01-22 ENCOUNTER — Other Ambulatory Visit: Payer: Self-pay

## 2022-01-22 VITALS — Ht 65.0 in | Wt 202.4 lb

## 2022-01-22 DIAGNOSIS — E118 Type 2 diabetes mellitus with unspecified complications: Secondary | ICD-10-CM

## 2022-01-22 DIAGNOSIS — E119 Type 2 diabetes mellitus without complications: Secondary | ICD-10-CM | POA: Insufficient documentation

## 2022-01-22 DIAGNOSIS — Z713 Dietary counseling and surveillance: Secondary | ICD-10-CM | POA: Insufficient documentation

## 2022-01-22 DIAGNOSIS — E669 Obesity, unspecified: Secondary | ICD-10-CM

## 2022-01-22 NOTE — Patient Instructions (Addendum)
Goals  Eat three meals per day at times discussed. Cut out sweets and sugar cereals and junk food Increase more fresh fruits and vegetables Drink only water, no diet sodas or sodas, tea or juice Test blood sugars 4 times per day. Talk Dr. About counseling and social worker.

## 2022-01-22 NOTE — Progress Notes (Signed)
Medical Nutrition Therapy  Appointment Start time:  0388  Appointment End time:  84  Primary concerns today: Dm Type 2, Obesity Referral diagnosis: E11.8, E66.9 Preferred learning style: visual Learning readiness:Ready   NUTRITION ASSESSMENT  Dx 2 months ago. Currently  taking Metformin 1000 mg BID  and Glipizide 5 mg/dl. FBS 184 this am, Before lunch 92 yesterday Before 204 mg/dl Anthropometrics  Wt Readings from Last 3 Encounters:  01/22/22 202 lb 6.4 oz (91.8 kg)  12/27/21 206 lb (93.4 kg)  12/01/21 201 lb 3.2 oz (91.3 kg)   Ht Readings from Last 3 Encounters:  01/22/22 _0  (1.651 m)  12/14/21 _1  (1.651 m)  12/01/21 5' 5.25" (1.657 m)   Body mass index is 33.68 kg/m. _2 @ Facility age limit for growth percentiles is 20 years. Facility age limit for growth percentiles is 20 years.    Clinical Medical Hx: Dm Type 2, Obesity, HTN Medications: See chart Labs:  Lab Results  Component Value Date   HGBA1C 8.8 11/01/2021   CMP Latest Ref Rng & Units 06/29/2021 04/24/2018 09/21/2015  Glucose 65 - 99 mg/dL - 126(H) 110(H)  BUN 4 - _3 Creatinine 0.5 - 1.1 0.8 0.80 0.92  Sodium 135 - 145 mmol/L - 139 140  Potassium 3.5 - 5.1 mmol/L - 4.4 4.0  Chloride 101 - 111 mmol/L - 103 104  CO2 22 - 32 mmol/L - 26 26  Calcium 8.9 - 10.3 mg/dL - 9.5 9.4  Total Protein 6.5 - 8.1 g/dL - - 8.5(H)  Total Bilirubin 0.3 - 1.2 mg/dL - - 0.5  Alkaline Phos 38 - 126 U/L - - 116  AST 15 - 41 U/L - - 27  ALT 14 - 54 U/L - - 30   Lipid Panel     Component Value Date/Time   TRIG 246 (A) 06/29/2021 0000   LDLCALC 60 06/29/2021 0000    Notable Signs/Symptoms: increased thirst, faint, weakness at times  Lifestyle & Dietary Hx Lives with her husband and sons. Her husband has a drinking problem and this presents an issue for her at home.He won't work. She notes she has to hide some of his alcohol. She is noted to have multiple bug bites on both her arms. She notes that she  thinks theyr'e bed bugs.   Estimated daily fluid intake: 16 oz Supplements: MVI Sleep: good Stress / self-care: her husband's drinking, her health Current average weekly physical activity: ADL  24-Hr Dietary Recall First Meal: [place] with 1% milk Snack:  Second Meal: 2 pieces candy when BS was low Snack:  Third Meal: Meatloaf, rice and mashed potatoes, mac/cheese, 2 rolls, pecan pie and ice cream Snack:  Beverages: water, soda  Estimated Energy Needs Calories: 1200 Carbohydrate: 135g Protein: 90g Fat: 33g   NUTRITION DIAGNOSIS  NB-1.1 Food and nutrition-related knowledge deficit As related to Diabetes Type 2.  As evidenced by A1C 8.8%.   NUTRITION INTERVENTION  Nutrition education (E-1) on the following topics:  Nutrition and Diabetes education provided on My Plate, CHO counting, meal planning, portion sizes, timing of meals, avoiding snacks between meals unless having a low blood sugar, target ranges for A1C and blood sugars, signs/symptoms and treatment of hyper/hypoglycemia, monitoring blood sugars, taking medications as prescribed, benefits of exercising 30 minutes per day and prevention of complications of DM.  My Plate, meal planning  Handouts Provided Include  Goals  Eat three meals per day at times discussed. Cut out sweets and sugar  cereals and junk food Increase more fresh fruits and vegetables Drink only water, no diet sodas or sodas, tea or juice Test blood sugars 4 times per day. Talk Dr. About counseling and social worker.  Learning Style & Readiness for Change Teaching method utilized: Visual & Auditory  Demonstrated degree of understanding via: Teach Back  Barriers to learning/adherence to lifestyle change: home environment  Goals Established by Pt Goals  Eat three meals per day at times discussed. Cut out sweets and sugar cereals and junk food Increase more fresh fruits and vegetables Drink only water, no diet sodas or sodas, tea or  juice Test blood sugars 4 times per day. Talk Dr. About counseling and social worker.   MONITORING & EVALUATION Dietary intake, weekly physical activity, and diet and blood sugars in 1 month.  Next Steps  Patient is to work on meal planning and better food choices.Marland Kitchen

## 2022-01-24 LAB — MICROALBUMIN, URINE: Microalb, Ur: 6

## 2022-01-24 LAB — LIPID PANEL
LDL Cholesterol: 68
Triglycerides: 210 — AB (ref 40–160)

## 2022-01-24 LAB — BASIC METABOLIC PANEL WITH GFR
BUN: 8 (ref 4–21)
Creatinine: 0.8 (ref 0.5–1.1)
Glucose: 163

## 2022-01-24 LAB — HEMOGLOBIN A1C: Hemoglobin A1C: 8.8

## 2022-01-24 LAB — COMPREHENSIVE METABOLIC PANEL WITH GFR: eGFR: 89

## 2022-01-29 ENCOUNTER — Ambulatory Visit (INDEPENDENT_AMBULATORY_CARE_PROVIDER_SITE_OTHER): Payer: Medicare Other | Admitting: Nurse Practitioner

## 2022-01-29 ENCOUNTER — Encounter: Payer: Self-pay | Admitting: Nurse Practitioner

## 2022-01-29 ENCOUNTER — Encounter: Payer: Self-pay | Admitting: Nutrition

## 2022-01-29 ENCOUNTER — Encounter: Payer: Medicare Other | Attending: Nurse Practitioner | Admitting: Nutrition

## 2022-01-29 VITALS — BP 109/69 | HR 74 | Ht 65.25 in | Wt 203.0 lb

## 2022-01-29 DIAGNOSIS — I10 Essential (primary) hypertension: Secondary | ICD-10-CM | POA: Diagnosis not present

## 2022-01-29 DIAGNOSIS — E119 Type 2 diabetes mellitus without complications: Secondary | ICD-10-CM | POA: Diagnosis present

## 2022-01-29 DIAGNOSIS — Z713 Dietary counseling and surveillance: Secondary | ICD-10-CM | POA: Insufficient documentation

## 2022-01-29 DIAGNOSIS — E782 Mixed hyperlipidemia: Secondary | ICD-10-CM

## 2022-01-29 DIAGNOSIS — Z7984 Long term (current) use of oral hypoglycemic drugs: Secondary | ICD-10-CM | POA: Diagnosis not present

## 2022-01-29 LAB — POCT GLYCOSYLATED HEMOGLOBIN (HGB A1C): HbA1c, POC (controlled diabetic range): 9 % — AB (ref 0.0–7.0)

## 2022-01-29 NOTE — Patient Instructions (Signed)
Goals ? ?Drink 4-5 bottles of water per day ?Cut out candy and easter candy. ?Don't bring in junk food in the house. ?Walk 20 minutes 3 times per day. ?Get A1C down 7%  ? ?The [employer] ([employer]) recommends nutrition derived mostly from Whole Food, Plant Predominant Sources example an apple instead of applesauce or apple pie. Eat Plenty of vegetables, Mushrooms, fruits, Legumes, Whole Grains, Nuts, seeds in lieu of processed meats, processed snacks/pastries red meat, poultry, eggs.  Use only water or unsweetened tea for hydration.  The [employer] also recommends the need to stay away from risky substances including alcohol, smoking; obtaining 7-9 hours of restorative sleep, at least 150 minutes of moderate intensity exercise weekly, importance of healthy social connections, and being mindful of stress and seek help when it is overwhelming. ?

## 2022-01-29 NOTE — Progress Notes (Signed)
Medical Nutrition Therapy  DM Follow up ?Appointment Start time:  0930  Appointment End time:  1000 ? ?Primary concerns today: Dm Type 2, Obesity ?Referral diagnosis: E11.8, E66.9 ?Preferred learning style: visual ?Learning readiness:Ready  ? ? ?NUTRITION ASSESSMENT  ?Saw [name] today  ?BS avg"s; 7 days, 170 mg/dl , 14 day 166 mg/dl and 30 day 189 mg/dl. ? ?Changes made: has been eating wheat bread, dirnking more water,  eating more fruits and vegetables. ?Talked to a Licensed conveyancer and has helped her connect to a [employer] in [place], [place]. ?She notes she is working on getting rid of junk food and trying to eat better and drink more water. ? ?Dx 2 months ago. ?Currently  taking Metformin 1000 mg BID  and Glipizide 5 mg/dl. ? ?Anthropometrics  ?Wt Readings from Last 3 Encounters:  ?01/29/22 203 lb (92.1 kg)  ?01/22/22 202 lb 6.4 oz (91.8 kg)  ?12/27/21 206 lb (93.4 kg)  ? ?Ht Readings from Last 3 Encounters:  ?01/29/22 5' 5.25" (1.657 m)  ?01/22/22 _0  (1.651 m)  ?12/14/21 _1  (1.651 m)  ? ?There is no height or weight on file to calculate BMI. ?_2 @ ?Facility age limit for growth percentiles is 20 years. ?Facility age limit for growth percentiles is 20 years. ?  ? ?Clinical ?Medical Hx: Dm Type 2, Obesity, HTN ?Medications: See chart ?Labs:  ?Lab Results  ?Component Value Date  ? HGBA1C 9.0 (A) 01/29/2022  ? ?CMP Latest Ref Rng & Units 06/29/2021 04/24/2018 09/21/2015  ?Glucose 65 - 99 mg/dL - 126(H) 110(H)  ?BUN 4 - _3 ?Creatinine 0.5 - 1.1 0.8 0.80 0.92  ?Sodium 135 - 145 mmol/L - 139 140  ?Potassium 3.5 - 5.1 mmol/L - 4.4 4.0  ?Chloride 101 - 111 mmol/L - 103 104  ?CO2 22 - 32 mmol/L - 26 26  ?Calcium 8.9 - 10.3 mg/dL - 9.5 9.4  ?Total Protein 6.5 - 8.1 g/dL - - 8.5(H)  ?Total Bilirubin 0.3 - 1.2 mg/dL - - 0.5  ?Alkaline Phos 38 - 126 U/L - - 116  ?AST 15 - 41 U/L - - 27  ?ALT 14 - 54 U/L - - 30  ? ?Lipid Panel  ?   ?Component Value Date/Time  ? TRIG 246 (A) 06/29/2021 0000  ? [place] 60  06/29/2021 0000  ? ? ?Notable Signs/Symptoms: increased thirst, faint, weakness at times ? ?Lifestyle & Dietary Hx ?Lives with her husband and sons. Her husband has a drinking problem and this presents an issue for her at home.He won't work. She notes she has to hide some of his alcohol. ?She is noted to have multiple bug bites on both her arms. She notes that she thinks theyr'e bed bugs.  ? ?Estimated daily fluid intake: 16 oz ?Supplements: MVI ?Sleep: good ?Stress / self-care: her husband's drinking, her health ?Current average weekly physical activity: ADL ? ?24-Hr Dietary Recall ?First Meal: [place] with 1% milk ?Snack:  ?Second Meal: 2 pieces candy when BS was low ?Snack:  ?Third Meal: Meatloaf, rice and mashed potatoes, mac/cheese, 2 rolls, pecan pie and ice cream ?Snack:  ?Beverages: water, soda ? ?Estimated Energy Needs ?Calories: 1200 ?Carbohydrate: 135g ?Protein: 90g ?Fat: 33g ? ? ?NUTRITION DIAGNOSIS  ?NB-1.1 Food and nutrition-related knowledge deficit As related to Diabetes Type 2.  As evidenced by A1C 8.8%. ? ? ?NUTRITION INTERVENTION  ?Nutrition education (E-1) on the following topics:  ?Nutrition and Diabetes education provided on My Plate, CHO counting, meal  planning, portion sizes, timing of meals, avoiding snacks between meals unless having a low blood sugar, target ranges for A1C and blood sugars, signs/symptoms and treatment of hyper/hypoglycemia, monitoring blood sugars, taking medications as prescribed, benefits of exercising 30 minutes per day and prevention of complications of DM. ? ?My Plate, meal planning ? ?Handouts Provided Include  ?Goals ? ?Drink 4-5 bottles of water per day ?Cut out candy and easter candy. ?Don't bring in junk food in the house. ?Walk 20 minutes 3 times per day. ?Get A1C down 7%  ? ?[employer] ([employer]) recommends nutrition derived mostly from Whole Food, Plant Predominant Sources example an apple instead of applesauce or apple pie.  Eat Plenty of vegetables, Mushrooms, fruits, Legumes, Whole Grains, Nuts, seeds in lieu of processed meats, processed snacks/pastries red meat, poultry, eggs.  Use only water or unsweetened tea for hydration.  The College also recommends the need to stay away from risky substances including alcohol, smoking; obtaining 7-9 hours of restorative sleep, at least 150 minutes of moderate intensity exercise weekly, importance of healthy social connections, and being mindful of stress and seek help when it is overwhelming. ? ?Learning Style & Readiness for Change ?Teaching method utilized: Visual & Auditory  ?Demonstrated degree of understanding via: Teach Back  ?Barriers to learning/adherence to lifestyle change: home environment ? ?Goals Established by Pt ? ?Drink 4-5 bottles of water per day ?Cut out candy and easter candy. ?Don't bring in junk food in the house. ?Walk 20 minutes 3 times per day. ?Get A1C down 7%  ? ?[employer] ([employer]) recommends nutrition derived mostly from Whole Food, Plant Predominant Sources example an apple instead of applesauce or apple pie. Eat Plenty of vegetables, Mushrooms, fruits, Legumes, Whole Grains, Nuts, seeds in lieu of processed meats, processed snacks/pastries red meat, poultry, eggs.  Use only water or unsweetened tea for hydration.  The [employer] also recommends the need to stay away from risky substances including alcohol, smoking; obtaining 7-9 hours of restorative sleep, at least 150 minutes of moderate intensity exercise weekly, importance of healthy social connections, and being mindful of stress and seek help when it is overwhelming. ?MONITORING & EVALUATION ?Dietary intake, weekly physical activity, and diet and blood sugars in 3 months. ? ?Next Steps  ?Patient is to work on meal planning and better food choices.. ? ?

## 2022-01-29 NOTE — Patient Instructions (Signed)

## 2022-01-29 NOTE — Progress Notes (Signed)
Endocrinology Follow Up Note       01/29/2022, 9:34 AM   Subjective:    Patient ID: Ann [name], female    DOB: May 07, 1971.  Ann [name] is being seen in follow up after being seen in consultation for management of currently uncontrolled symptomatic diabetes requested by  Coolidge Breeze, FNP.   Past Medical History:  Diagnosis Date   Alcohol abuse    Anemia    Arthritis    KNEE   Bipolar 1 disorder (HCC)    Complication of anesthesia    HARD TO WAKE UP WITH CERVIX SURGERY   Depression    Diabetes mellitus, type II ([place])    Drug abuse ([place])    Dysrhythmia    SVT PER CARDIOLOGY NOTE IN 2016   GERD (gastroesophageal reflux disease)    Hyperlipidemia    Schizophrenia ([place])    Smokers' cough ([place])     Past Surgical History:  Procedure Laterality Date   CERVIX SURGERY     EXCISION ORAL TUMOR Right 12/27/2021   Procedure: EXCISION OF SOFT PALATE MASS;  Surgeon: Carloyn Manner, MD;  Location: [facility];  Service: ENT;  Laterality: Right;  Diabetic   GALLBLADDER SURGERY  06/15/2020   HYSTEROSCOPY WITH D & C N/A 04/28/2018   Procedure: DILATATION AND CURETTAGE /HYSTEROSCOPY;  Surgeon: Ward, Honor Loh, MD;  Location: [facility];  Service: Gynecology;  Laterality: N/A;   POLYPECTOMY  04/28/2018   Procedure: POLYPECTOMY;  Surgeon: Ward, Honor Loh, MD;  Location: [facility];  Service: Gynecology;;   TUBAL LIGATION      Social History   Socioeconomic History   Marital status: Legally Separated    Spouse name: Not on file   Number of children: Not on file   Years of education: Not on file   Highest education level: Not on file  Occupational History   Not on file  Tobacco Use   Smoking status: Every Day    Packs/day: 3.00    Years: 30.00    Pack years: 90.00    Types: Cigarettes   Smokeless tobacco: Never   Tobacco comments:    Currently only smokes 2 PPD  Vaping Use   Vaping Use:  Never used  Substance and Sexual Activity   Alcohol use: Not Currently   Drug use: Yes    Types: Cocaine, Marijuana    Comment: pt reports none since 2017   Sexual activity: Not on file  Other Topics Concern   Not on file  Social History Narrative   Not on file   Social Determinants of Health   Financial Resource Strain: Medium Risk   Difficulty of Paying Living Expenses: Somewhat hard  Food Insecurity: Food Insecurity Present   Worried About Charity fundraiser in the Last Year: Sometimes true   Arboriculturist in the Last Year: Often true  Transportation Needs: Unmet Transportation Needs   Lack of Transportation (Medical): Yes   Lack of Transportation (Non-Medical): Yes  Physical Activity: Not on file  Stress: Stress Concern Present   Feeling of Stress : Very much  Social Connections: Not on file    Family History  Problem Relation Age of  Onset   Hypertension Mother    Diabetes Mother    Hyperlipidemia Mother    Heart attack Mother    Cancer Father     Outpatient Encounter Medications as of 01/29/2022  Medication Sig   atenolol (TENORMIN) 25 MG tablet Take 25 mg by mouth daily.   chlorhexidine (PERIDEX) 0.12 % solution SMARTSIG:By Mouth   gabapentin (NEURONTIN) 300 MG capsule Take 300 mg by mouth 3 (three) times daily.   glipiZIDE (GLUCOTROL XL) 5 MG 24 hr tablet Take 5 mg by mouth daily.   GNP ASPIRIN LOW DOSE 81 MG EC tablet Take 81 mg by mouth daily.   GNP NICOTINE POLACRILEX 4 MG gum Take by mouth.   haloperidol lactate (HALDOL) 5 MG/ML injection Inject 100 mg into the muscle every 30 (thirty) days.    HYDROcodone-acetaminophen (HYCET) 7.5-325 mg/15 ml solution Take 10 mLs by mouth every 6 (six) hours as needed for moderate pain.   metFORMIN (GLUCOPHAGE) 1000 MG tablet Take 1,000 mg by mouth 2 (two) times daily with a meal.   Multiple Vitamins-Minerals (CENTRUM SILVER ADULT 50+ PO) Take by mouth daily.   mupirocin ointment (BACTROBAN) 2 % SMARTSIG:1 Application  Topical 2-3 Times Daily   omeprazole (PRILOSEC) 20 MG capsule Take 40 mg by mouth every morning.    ondansetron (ZOFRAN) 4 MG tablet Take 1 tablet (4 mg total) by mouth every 8 (eight) hours as needed for up to 10 doses for nausea or vomiting.   rosuvastatin (CRESTOR) 40 MG tablet Take 40 mg by mouth daily.   topiramate (TOPAMAX) 50 MG tablet Take 50 mg by mouth 2 (two) times daily.   TRUE METRIX BLOOD GLUCOSE TEST test strip SMARTSIG:Via Meter   TRUEplus Lancets 33G MISC SMARTSIG:1 Topical Daily   VRAYLAR 3 MG capsule Take 3 mg by mouth daily.   VRAYLAR 1.5 MG capsule Take 1.5 mg by mouth daily. (Patient not taking: Reported on 01/29/2022)   No facility-administered encounter medications on file as of 01/29/2022.    ALLERGIES: No Known Allergies  VACCINATION STATUS:  There is no immunization history on file for this patient.  Diabetes She presents for her follow-up diabetic visit. She has type 2 diabetes mellitus. Onset time: Diagnoses at approx age of 89. Her disease course has been improving. There are no hypoglycemic associated symptoms. Associated symptoms include fatigue. There are no hypoglycemic complications. Symptoms are improving. Diabetic complications include heart disease. Risk factors for coronary artery disease include diabetes mellitus, dyslipidemia, family history, obesity, hypertension, sedentary lifestyle and tobacco exposure. Current diabetic treatment includes oral agent (dual therapy). She is compliant with treatment most of the time. Her weight is fluctuating minimally. She is following a generally unhealthy diet. When asked about meal planning, she reported none. She has not had a previous visit with a dietitian. She rarely participates in exercise. Her home blood glucose trend is decreasing steadily. Her breakfast blood glucose range is generally 180-200 mg/dl. Her lunch blood glucose range is generally 140-180 mg/dl. Her dinner blood glucose range is generally 110-130  mg/dl. Her bedtime blood glucose range is generally 140-180 mg/dl. (She presents today with her meter and logs showing improved glycemic profile with slightly above target fasting and at goal postprandial readings.  Her POCT A1c today is 9%, increasing slightly from last visit of 8.8%.  She says she has seen improvement in her readings from switching from regular sodas to diet soda.  She denies any hypoglycemia.) An ACE inhibitor/angiotensin II receptor blocker is not being taken.  She does not see a podiatrist.Eye exam is current.  Hyperlipidemia This is a chronic problem. The current episode started more than 1 year ago. The problem is uncontrolled. Recent lipid tests were reviewed and are variable. Exacerbating diseases include diabetes. Factors aggravating her hyperlipidemia include fatty foods, smoking and beta blockers. Current antihyperlipidemic treatment includes statins. The current treatment provides mild improvement of lipids. Compliance problems include adherence to diet and adherence to exercise.  Risk factors for coronary artery disease include diabetes mellitus, dyslipidemia, family history, obesity, hypertension and a sedentary lifestyle.  Hypertension This is a chronic problem. The current episode started more than 1 year ago. The problem has been resolved since onset. The problem is controlled. There are no associated agents to hypertension. Risk factors for coronary artery disease include smoking/tobacco exposure, diabetes mellitus, dyslipidemia, family history, obesity and sedentary lifestyle. Past treatments include beta blockers. The current treatment provides moderate improvement. Compliance problems include diet and exercise.  Hypertensive end-organ damage includes CAD/MI.    Review of systems  Constitutional: + Minimally fluctuating body weight, current Body mass index is 33.52 kg/m., + fatigue, no subjective hyperthermia, no subjective hypothermia Eyes: no blurry vision, no  xerophthalmia ENT: no sore throat, no nodules palpated in throat, no dysphagia/odynophagia, no hoarseness Cardiovascular: no chest pain, no shortness of breath, no palpitations, no leg swelling Respiratory: no cough, no shortness of breath Gastrointestinal: no nausea/vomiting/diarrhea Musculoskeletal: no muscle/joint aches Skin: no rashes, no hyperemia Neurological: no tremors, no numbness, no tingling, no dizziness Psychiatric: no depression, no anxiety, mood disorder (schizophrenia)- managed with meds  Objective:     BP 109/69    Pulse 74    Ht 5' 5.25" (1.657 m)    Wt 203 lb (92.1 kg)    SpO2 100%    BMI 33.52 kg/m   Wt Readings from Last 3 Encounters:  01/29/22 203 lb (92.1 kg)  01/22/22 202 lb 6.4 oz (91.8 kg)  12/27/21 206 lb (93.4 kg)     BP Readings from Last 3 Encounters:  01/29/22 109/69  12/27/21 100/63  12/01/21 109/70     Physical Exam- Limited  Constitutional:  Body mass index is 33.52 kg/m. , not in acute distress Eyes:  EOMI, no exophthalmos Neck: Supple Cardiovascular: RRR, no murmurs, rubs, or gallops, no edema Respiratory: Adequate breathing efforts, no crackles, rales, rhonchi, or wheezing Musculoskeletal: no gross deformities, strength intact in all four extremities, no gross restriction of joint movements Skin:  no rashes, no hyperemia, multiple skin lesions to upper extremities in various stages of healing, nicotinic discoloration to fingernails bilaterally Neurological: no tremor with outstretched hands    CMP ( most recent) CMP     Component Value Date/Time   NA 139 04/24/2018 0938   K 4.4 04/24/2018 0938   CL 103 04/24/2018 0938   CO2 26 04/24/2018 0938   GLUCOSE 126 (H) 04/24/2018 0938   BUN 6 06/29/2021 0000   CREATININE 0.8 06/29/2021 0000   CREATININE 0.80 04/24/2018 0938   CALCIUM 9.5 04/24/2018 0938   PROT 8.5 (H) 09/21/2015 1319   ALBUMIN 4.5 09/21/2015 1319   AST 27 09/21/2015 1319   ALT 30 09/21/2015 1319   ALKPHOS 116  09/21/2015 1319   BILITOT 0.5 09/21/2015 1319   GFRNONAA 96 06/29/2021 0000   GFRAA >60 04/24/2018 0938     Diabetic Labs (most recent): Lab Results  Component Value Date   HGBA1C 9.0 (A) 01/29/2022   HGBA1C 8.8 11/01/2021     Lipid Panel ( most  recent) Lipid Panel     Component Value Date/Time   TRIG 246 (A) 06/29/2021 0000   LDLCALC 60 06/29/2021 0000      Lab Results  Component Value Date   TSH 1.88 06/29/2021           Assessment & Plan:   1) Type 2 diabetes mellitus without complication, without long-term current use of insulin ([place])  She presents today with her meter and logs showing improved glycemic profile with slightly above target fasting and at goal postprandial readings.  Her POCT A1c today is 9%, increasing slightly from last visit of 8.8%.  She says she has seen improvement in her readings from switching from regular sodas to diet soda.  She denies any hypoglycemia.  - Ann [name] has currently uncontrolled symptomatic type 2 DM since 51 years of age.   -Recent labs reviewed.  - I had a long discussion with her about the progressive nature of diabetes and the pathology behind its complications. -her diabetes is complicated by CAD, smoking and she remains at a high risk for more acute and chronic complications which include CAD, CVA, CKD, retinopathy, and neuropathy. These are all discussed in detail with her.  - Nutritional counseling repeated at each appointment due to patients tendency to fall back in to old habits.  - The patient admits there is a room for improvement in their diet and drink choices. -  Suggestion is made for the patient to avoid simple carbohydrates from their diet including Cakes, Sweet Desserts / Pastries, Ice Cream, Soda (diet and regular), Sweet Tea, Candies, Chips, Cookies, Sweet Pastries, Store Bought Juices, Alcohol in Excess of 1-2 drinks a day, Artificial Sweeteners, Coffee Creamer, and "Sugar-free" Products. This will  help patient to have stable blood glucose profile and potentially avoid unintended weight gain.   - I encouraged the patient to switch to unprocessed or minimally processed complex starch and increased protein intake (animal or plant source), fruits, and vegetables.   - Patient is advised to stick to a routine mealtimes to eat 3 meals a day and avoid unnecessary snacks (to snack only to correct hypoglycemia).  - she will be scheduled with Ann [name], RDN, CDE for diabetes education-sees her after her appointment with me today.  - I have approached her with the following individualized plan to manage her diabetes and patient agrees:   -She is advised to continue Metformin 1000 mg po twice daily with meals and Glipizide 5 mg XL daily with breakfast.   -she is encouraged to continue monitoring glucose twice daily, before breakfast and before bed, and to call the clinic if she has readings less than 70 or above 200 for 3 tests in a row.   - Adjustment parameters are given to her for hypo and hyperglycemia in writing.  - she is not an ideal candidate for incretin therapy due to hypertriglyceridemia and heavy smoking history increasing her risk of pancreatitis.  - Specific targets for  A1c; LDL, HDL, and Triglycerides were discussed with the patient.  2) Blood Pressure /Hypertension:  her blood pressure is controlled to target.   she is advised to continue her current medications including Atenolol 25 mg p.o. daily with breakfast.  3) Lipids/Hyperlipidemia:    Review of her recent lipid panel from 06/29/21 showed controlled LDL at 60 and elevated triglycerides of 246 .  she is advised to continue Crestor 40 mg daily at bedtime.  Side effects and precautions discussed with her.  4)  Weight/Diet:  her Body mass index is 33.52 kg/m.  -  clearly complicating her diabetes care.   she is a candidate for weight loss. I discussed with her the fact that loss of 5 - 10% of her  current body weight will  have the most impact on her diabetes management.  Exercise, and detailed carbohydrates information provided  -  detailed on discharge instructions.  5) Chronic Care/Health Maintenance: -she is not on ACEI/ARB and is on Statin medications and is encouraged to initiate and continue to follow up with Ophthalmology, Dentist, Podiatrist at least yearly or according to recommendations, and advised to [employer]. I have recommended yearly flu vaccine and pneumonia vaccine at least every 5 years; moderate intensity exercise for up to 150 minutes weekly; and sleep for at least 7 hours a day.  - she is advised to maintain close follow up with Coolidge Breeze, FNP for primary care needs, as well as her other providers for optimal and coordinated care.  I have requested the patient reach out to her PCP to have recent labs faxed to [place] for our records.     I spent 42 minutes in the care of the patient today including review of labs from [facility], Lipids, Thyroid Function, Hematology (current and previous including abstractions from other facilities); face-to-face time discussing  her blood glucose readings/logs, discussing hypoglycemia and hyperglycemia episodes and symptoms, medications doses, her options of short and long term treatment based on the latest standards of care / guidelines;  discussion about incorporating lifestyle medicine;  and documenting the encounter.    Please refer to Patient Instructions for Blood Glucose Monitoring and Insulin/Medications Dosing Guide"  in media tab for additional information. Please  also refer to " Patient Self Inventory" in the Media  tab for reviewed elements of pertinent patient history.  Ann [name] participated in the discussions, expressed understanding, and voiced agreement with the above plans.  All questions were answered to her satisfaction. she is encouraged to contact clinic should she have any questions or concerns prior to her return visit.     Follow  up plan: - Return in about 3 months (around 05/01/2022) for Diabetes F/U with A1c in office, Bring meter and logs, No previsit labs.    Rayetta Pigg, [facility] [address] [place], [place] [address] Phone: [phone] Fax: [phone]  01/29/2022, 9:34 AM

## 2022-01-30 ENCOUNTER — Other Ambulatory Visit (HOSPITAL_COMMUNITY): Payer: Self-pay | Admitting: Family Medicine

## 2022-01-30 ENCOUNTER — Other Ambulatory Visit: Payer: Self-pay | Admitting: Family Medicine

## 2022-01-30 DIAGNOSIS — R748 Abnormal levels of other serum enzymes: Secondary | ICD-10-CM

## 2022-02-08 ENCOUNTER — Other Ambulatory Visit: Payer: Self-pay

## 2022-02-08 ENCOUNTER — Ambulatory Visit (HOSPITAL_COMMUNITY)
Admission: RE | Admit: 2022-02-08 | Discharge: 2022-02-08 | Disposition: A | Discharge status: Home / Self Care | Payer: Medicare Other | Source: Ambulatory Visit | Attending: Family Medicine | Admitting: Family Medicine

## 2022-02-08 DIAGNOSIS — R748 Abnormal levels of other serum enzymes: Secondary | ICD-10-CM | POA: Insufficient documentation

## 2022-05-01 ENCOUNTER — Encounter: Payer: Medicare Other | Attending: Nurse Practitioner | Admitting: Nutrition

## 2022-05-01 ENCOUNTER — Ambulatory Visit (INDEPENDENT_AMBULATORY_CARE_PROVIDER_SITE_OTHER): Payer: Medicare Other | Admitting: Nurse Practitioner

## 2022-05-01 ENCOUNTER — Encounter: Payer: Self-pay | Admitting: Nurse Practitioner

## 2022-05-01 VITALS — Ht 65.0 in | Wt 194.8 lb

## 2022-05-01 VITALS — BP 113/75 | HR 70 | Ht 65.25 in | Wt 194.0 lb

## 2022-05-01 DIAGNOSIS — E119 Type 2 diabetes mellitus without complications: Secondary | ICD-10-CM

## 2022-05-01 DIAGNOSIS — I10 Essential (primary) hypertension: Secondary | ICD-10-CM | POA: Diagnosis not present

## 2022-05-01 DIAGNOSIS — E118 Type 2 diabetes mellitus with unspecified complications: Secondary | ICD-10-CM | POA: Insufficient documentation

## 2022-05-01 DIAGNOSIS — E782 Mixed hyperlipidemia: Secondary | ICD-10-CM | POA: Diagnosis not present

## 2022-05-01 DIAGNOSIS — E669 Obesity, unspecified: Secondary | ICD-10-CM | POA: Diagnosis present

## 2022-05-01 LAB — POCT GLYCOSYLATED HEMOGLOBIN (HGB A1C): Hemoglobin A1C: 7.2 % — AB (ref 4.0–5.6)

## 2022-05-01 MED ORDER — METFORMIN HCL 1000 MG PO TABS: 1000.0000 mg | ORAL_TABLET | Freq: Two times a day (BID) | ORAL | 3 refills | Status: AC

## 2022-05-01 MED ORDER — GLIPIZIDE ER 5 MG PO TB24: 5.0000 mg | ORAL_TABLET | Freq: Every day | ORAL | 3 refills | Status: DC

## 2022-05-01 NOTE — Patient Instructions (Addendum)
Goals  Increase walk 20 minutes three times per week. Cut down to pack of cigarettes a day Limit to 1 soda and less diet soda.   Lifestyle Medicine  - Whole Food, Plant Predominant Nutrition is highly recommended: Eat Plenty of vegetables, Mushrooms, fruits, Legumes, Whole Grains, Nuts, seeds in lieu of processed meats, processed snacks/pastries red meat, poultry, eggs.    -It is better to avoid simple carbohydrates including: Cakes, Sweet Desserts, Ice Cream, Soda (diet and regular), Sweet Tea, Candies, Chips, Cookies, Store Bought Juices, Alcohol in Excess of  1-2 drinks a day, Lemonade,  Artificial Sweeteners, Doughnuts, Coffee Creamers, "Sugar-free" Products, etc, etc.  This is not a complete list.....  Exercise: If you are able: 30 -60 minutes a day ,4 days a week, or 150 minutes a week.  The longer the better.  Combine stretch, strength, and aerobic activities.  If you were told in the past that you have high risk for cardiovascular diseases, you may seek evaluation by your heart doctor prior to initiating moderate to intense exercise programs.

## 2022-05-01 NOTE — Progress Notes (Signed)
Medical Nutrition Therapy  DM Follow up Appointment Start time:  0915  Appointment End time:  0945  Primary concerns today: Dm Type 2, Obesity Referral diagnosis: E11.8, E66.9 Preferred learning style: visual Learning readiness:Ready    NUTRITION ASSESSMENT  Saw Ann [name] today  A1C 7.2% down from 9%. Feels betters. Lost 9 lbs.  CHanges made: Drinking more water, Has cut back on sodas. Eating more whole wheat, Eating more fruits and vegetables. Walked some, and  is trying more active. 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 Drinking 5 bottles of water. FBS: 120's  Bedtime 120-140's. Trying to quit smoking.  2 packs.  Currently  taking Metformin 1000 mg BID  and Glipizide 5 mg/dl.  Anthropometrics  Wt Readings from Last 3 Encounters:  05/01/22 194 lb (88 kg)  01/29/22 203 lb (92.1 kg)  01/29/22 203 lb (92.1 kg)   Ht Readings from Last 3 Encounters:  05/01/22 5' 5.25" (1.657 m)  01/29/22 5' 5.25" (1.657 m)  01/22/22 _0  (1.651 m)   There is no height or weight on file to calculate BMI. _1 @ Facility age limit for growth percentiles is 20 years. Facility age limit for growth percentiles is 20 years.    Clinical Medical Hx: Dm Type 2, Obesity, HTN Medications: See chart Labs:  Lab Results  Component Value Date   HGBA1C 7.2 (A) 05/01/2022        Latest Ref Rng & Units 01/24/2022   12:00 AM 06/29/2021   12:00 AM 04/24/2018    9:38 AM  CMP  Glucose 65 - 99 mg/dL   126    BUN 4 - _2 Creatinine 0.5 - 1.1 0.8      0.8      0.80    Sodium 135 - 145 mmol/L   139    Potassium 3.5 - 5.1 mmol/L   4.4    Chloride 101 - 111 mmol/L   103    CO2 22 - 32 mmol/L   26    Calcium 8.9 - 10.3 mg/dL   9.5       This result is from an external source.   Lipid Panel     Component Value Date/Time   TRIG 210 (A) 01/24/2022 0000   LDLCALC 68 01/24/2022 0000    Notable Signs/Symptoms: increased thirst, faint, weakness at  times  Lifestyle & Dietary Hx Lives with her husband and sons. Her husband has a drinking problem and this presents an issue for her at home.He won't work. She notes she has to hide some of his alcohol. She is noted to have multiple bug bites on both her arms. She notes that she thinks theyr'e bed bugs.   Estimated daily fluid intake: 16 oz Supplements: MVI Sleep: good Stress / self-care: her husband's drinking, her health Current average weekly physical activity: ADL  24-Hr Dietary Recall First Meal: Eggs and toast, Snack:  Second Meal: 6 in sub, water Snack:  Third Meal: meatloaf, green beans and mac/cheese, water Snack:  Beverages: water, soda  Estimated Energy Needs Calories: 1200 Carbohydrate: 135g Protein: 90g Fat: 33g   NUTRITION DIAGNOSIS  NB-1.1 Food and nutrition-related knowledge deficit As related to Diabetes Type 2.  As evidenced by A1C 8.8%.   NUTRITION INTERVENTION  Nutrition education (E-1) on the following topics:  Nutrition and Diabetes education provided on My Plate, CHO counting, meal planning, portion sizes, timing  of meals, avoiding snacks between meals unless having a low blood sugar, target ranges for A1C and blood sugars, signs/symptoms and treatment of hyper/hypoglycemia, monitoring blood sugars, taking medications as prescribed, benefits of exercising 30 minutes per day and prevention of complications of DM.  My Plate, meal planning  Handouts Provided Include  Goals  Increase walk 20 minutes three times per week. Cut down to pack of cigarettes a day Limit to 1 soda and less diet soda.  The [employer] of Lifestyle Medicine (ACL M) recommends nutrition derived mostly from Whole Food, Plant Predominant Sources example an apple instead of applesauce or apple pie. Eat Plenty of vegetables, Mushrooms, fruits, Legumes, Whole Grains, Nuts, seeds in lieu of processed meats, processed snacks/pastries red meat, poultry, eggs.  Use only water or  unsweetened tea for hydration.  The College also recommends the need to stay away from risky substances including alcohol, smoking; obtaining 7-9 hours of restorative sleep, at least 150 minutes of moderate intensity exercise weekly, importance of healthy social connections, and being mindful of stress and seek help when it is overwhelming.  Learning Style & Readiness for Change Teaching method utilized: Visual & Auditory  Demonstrated degree of understanding via: Teach Back  Barriers to learning/adherence to lifestyle change: home environment  Goals Established by Pt  Drink 4-5 bottles of water per day Cut out candy and easter candy. Don't bring in junk food in the house. Walk 20 minutes 3 times per day. Get A1C down 7%   The [employer] of Lifestyle Medicine (ACL M) recommends nutrition derived mostly from Whole Food, Plant Predominant Sources example an apple instead of applesauce or apple pie. Eat Plenty of vegetables, Mushrooms, fruits, Legumes, Whole Grains, Nuts, seeds in lieu of processed meats, processed snacks/pastries red meat, poultry, eggs.  Use only water or unsweetened tea for hydration.  The College also recommends the need to stay away from risky substances including alcohol, smoking; obtaining 7-9 hours of restorative sleep, at least 150 minutes of moderate intensity exercise weekly, importance of healthy social connections, and being mindful of stress and seek help when it is overwhelming. MONITORING & EVALUATION Dietary intake, weekly physical activity, and diet and blood sugars in 3 months.  Next Steps  Patient is to work on meal planning and better food choices.Marland Kitchen

## 2022-05-01 NOTE — Patient Instructions (Signed)
Diabetes Mellitus and Foot Care Foot care is an important part of your health, especially when you have diabetes. Diabetes may cause you to have problems because of poor blood flow (circulation) to your feet and legs, which can cause your skin to: Become thinner and drier. Break more easily. Heal more slowly. Peel and crack. You may also have nerve damage (neuropathy) in your legs and feet, causing decreased feeling in them. This means that you may not notice minor injuries to your feet that could lead to more serious problems. Noticing and addressing any potential problems early is the best way to prevent future foot problems. How to care for your feet Foot hygiene  Wash your feet daily with warm water and mild soap. Do not use hot water. Then, pat your feet and the areas between your toes until they are completely dry. Do not soak your feet as this can dry your skin. Trim your toenails straight across. Do not dig under them or around the cuticle. File the edges of your nails with an emery board or nail file. Apply a moisturizing lotion or petroleum jelly to the skin on your feet and to dry, brittle toenails. Use lotion that does not contain alcohol and is unscented. Do not apply lotion between your toes. Shoes and socks Wear clean socks or stockings every day. Make sure they are not too tight. Do not wear knee-high stockings since they may decrease blood flow to your legs. Wear shoes that fit properly and have enough cushioning. Always look in your shoes before you put them on to be sure there are no objects inside. To break in new shoes, wear them for just a few hours a day. This prevents injuries on your feet. Wounds, scrapes, corns, and calluses  Check your feet daily for blisters, cuts, bruises, sores, and redness. If you cannot see the bottom of your feet, use a mirror or ask someone for help. Do not cut corns or calluses or try to remove them with medicine. If you find a minor scrape,  cut, or break in the skin on your feet, keep it and the skin around it clean and dry. You may clean these areas with mild soap and water. Do not clean the area with peroxide, alcohol, or iodine. If you have a wound, scrape, corn, or callus on your foot, look at it several times a day to make sure it is healing and not infected. Check for: Redness, swelling, or pain. Fluid or blood. Warmth. Pus or a bad smell. General tips Do not cross your legs. This may decrease blood flow to your feet. Do not use heating pads or hot water bottles on your feet. They may burn your skin. If you have lost feeling in your feet or legs, you may not know this is happening until it is too late. Protect your feet from hot and cold by wearing shoes, such as at the beach or on hot pavement. Schedule a complete foot exam at least once a year (annually) or more often if you have foot problems. Report any cuts, sores, or bruises to your health care provider immediately. Where to find more information American Diabetes Association: www.diabetes.org Association of Diabetes Care & Education Specialists: www.diabeteseducator.org Contact a health care provider if: You have a medical condition that increases your risk of infection and you have any cuts, sores, or bruises on your feet. You have an injury that is not healing. You have redness on your legs or feet. You   feel burning or tingling in your legs or feet. You have pain or cramps in your legs and feet. Your legs or feet are numb. Your feet always feel cold. You have pain around any toenails. Get help right away if: You have a wound, scrape, corn, or callus on your foot and: You have pain, swelling, or redness that gets worse. You have fluid or blood coming from the wound, scrape, corn, or callus. Your wound, scrape, corn, or callus feels warm to the touch. You have pus or a bad smell coming from the wound, scrape, corn, or callus. You have a fever. You have a red  line going up your leg. Summary Check your feet every day for blisters, cuts, bruises, sores, and redness. Apply a moisturizing lotion or petroleum jelly to the skin on your feet and to dry, brittle toenails. Wear shoes that fit properly and have enough cushioning. If you have foot problems, report any cuts, sores, or bruises to your health care provider immediately. Schedule a complete foot exam at least once a year (annually) or more often if you have foot problems. This information is not intended to replace advice given to you by your health care provider. Make sure you discuss any questions you have with your health care provider. Document Revised: 06/02/2020 Document Reviewed: 06/02/2020 Elsevier Patient Education  2023 Elsevier Inc.  

## 2022-05-01 NOTE — Progress Notes (Signed)
Endocrinology Follow Up Note       05/01/2022, 9:29 AM   Subjective:    Patient ID: Ann [name], female    DOB: February 10, 1971.  Ann [name] is being seen in follow up after being seen in consultation for management of currently uncontrolled symptomatic diabetes requested by  Coolidge Breeze, FNP.   Past Medical History:  Diagnosis Date   Alcohol abuse    Anemia    Arthritis    KNEE   Bipolar 1 disorder (HCC)    Complication of anesthesia    HARD TO WAKE UP WITH CERVIX SURGERY   Depression    Diabetes mellitus, type II ([place])    Drug abuse ([place])    Dysrhythmia    SVT PER CARDIOLOGY NOTE IN 2016   GERD (gastroesophageal reflux disease)    Hyperlipidemia    Schizophrenia ([place])    Smokers' cough ([place])     Past Surgical History:  Procedure Laterality Date   CERVIX SURGERY     EXCISION ORAL TUMOR Right 12/27/2021   Procedure: EXCISION OF SOFT PALATE MASS;  Surgeon: Carloyn Manner, MD;  Location: [facility];  Service: ENT;  Laterality: Right;  Diabetic   GALLBLADDER SURGERY  06/15/2020   HYSTEROSCOPY WITH D & C N/A 04/28/2018   Procedure: DILATATION AND CURETTAGE /HYSTEROSCOPY;  Surgeon: Ward, Honor Loh, MD;  Location: [facility];  Service: Gynecology;  Laterality: N/A;   POLYPECTOMY  04/28/2018   Procedure: POLYPECTOMY;  Surgeon: Ward, Honor Loh, MD;  Location: [facility];  Service: Gynecology;;   TUBAL LIGATION      Social History   Socioeconomic History   Marital status: Legally Separated    Spouse name: Not on file   Number of children: Not on file   Years of education: Not on file   Highest education level: Not on file  Occupational History   Not on file  Tobacco Use   Smoking status: Every Day    Packs/day: 3.00    Years: 30.00    Pack years: 90.00    Types: Cigarettes   Smokeless tobacco: Never   Tobacco comments:    Currently only smokes 2 PPD  Vaping Use   Vaping Use:  Never used  Substance and Sexual Activity   Alcohol use: Not Currently   Drug use: Yes    Types: Cocaine, Marijuana    Comment: pt reports none since 2017   Sexual activity: Not on file  Other Topics Concern   Not on file  Social History Narrative   Not on file   Social Determinants of Health   Financial Resource Strain: Medium Risk   Difficulty of Paying Living Expenses: Somewhat hard  Food Insecurity: Food Insecurity Present   Worried About Charity fundraiser in the Last Year: Sometimes true   Arboriculturist in the Last Year: Often true  Transportation Needs: Unmet Transportation Needs   Lack of Transportation (Medical): Yes   Lack of Transportation (Non-Medical): Yes  Physical Activity: Not on file  Stress: Stress Concern Present   Feeling of Stress : Very much  Social Connections: Not on file    Family History  Problem Relation Age of  Onset   Hypertension Mother    Diabetes Mother    Hyperlipidemia Mother    Heart attack Mother    Cancer Father     Outpatient Encounter Medications as of 05/01/2022  Medication Sig   atenolol (TENORMIN) 25 MG tablet Take 25 mg by mouth daily.   chlorhexidine (PERIDEX) 0.12 % solution SMARTSIG:By Mouth   gabapentin (NEURONTIN) 300 MG capsule Take 300 mg by mouth 3 (three) times daily.   glipiZIDE (GLUCOTROL XL) 5 MG 24 hr tablet Take 1 tablet (5 mg total) by mouth daily with breakfast.   GNP ASPIRIN LOW DOSE 81 MG EC tablet Take 81 mg by mouth daily.   GNP NICOTINE POLACRILEX 4 MG gum Take by mouth.   haloperidol lactate (HALDOL) 5 MG/ML injection Inject 100 mg into the muscle every 30 (thirty) days.    HYDROcodone-acetaminophen (HYCET) 7.5-325 mg/15 ml solution Take 10 mLs by mouth every 6 (six) hours as needed for moderate pain.   metFORMIN (GLUCOPHAGE) 1000 MG tablet Take 1 tablet (1,000 mg total) by mouth 2 (two) times daily with a meal.   Multiple Vitamins-Minerals (CENTRUM SILVER ADULT 50+ PO) Take by mouth daily.   mupirocin  ointment (BACTROBAN) 2 % SMARTSIG:1 Application Topical 2-3 Times Daily   omeprazole (PRILOSEC) 20 MG capsule Take 40 mg by mouth every morning.    ondansetron (ZOFRAN) 4 MG tablet Take 1 tablet (4 mg total) by mouth every 8 (eight) hours as needed for up to 10 doses for nausea or vomiting.   rosuvastatin (CRESTOR) 40 MG tablet Take 40 mg by mouth daily.   topiramate (TOPAMAX) 50 MG tablet Take 50 mg by mouth 2 (two) times daily.   TRUE METRIX BLOOD GLUCOSE TEST test strip SMARTSIG:Via Meter   TRUEplus Lancets 33G MISC SMARTSIG:1 Topical Daily   VRAYLAR 3 MG capsule Take 3 mg by mouth daily.   [DISCONTINUED] glipiZIDE (GLUCOTROL XL) 5 MG 24 hr tablet Take 5 mg by mouth daily.   [DISCONTINUED] metFORMIN (GLUCOPHAGE) 1000 MG tablet Take 1,000 mg by mouth 2 (two) times daily with a meal.   No facility-administered encounter medications on file as of 05/01/2022.    ALLERGIES: No Known Allergies  VACCINATION STATUS:  There is no immunization history on file for this patient.  Diabetes She presents for her follow-up diabetic visit. She has type 2 diabetes mellitus. Onset time: Diagnoses at approx age of 10. Her disease course has been improving. There are no hypoglycemic associated symptoms. Associated symptoms include fatigue. There are no hypoglycemic complications. Symptoms are improving. Diabetic complications include heart disease. Risk factors for coronary artery disease include diabetes mellitus, dyslipidemia, family history, obesity, hypertension, sedentary lifestyle and tobacco exposure. Current diabetic treatment includes oral agent (dual therapy). She is compliant with treatment most of the time. Her weight is decreasing steadily. She is following a generally healthy diet. When asked about meal planning, she reported none. She has not had a previous visit with a dietitian. She rarely participates in exercise. Her home blood glucose trend is decreasing steadily. Her overall blood glucose  range is 130-140 mg/dl. (She presents today with her meter and logs showing improving, slightly above target glycemic profile.  Her POCT A1c today is 7.2%, improving from last visit of 8.8%.  She denies any significant hypoglycemia.  She has been working hard on diet and incorporating more exercise into her daily routine.  Analysis of her meter shows 7-day average of 130, 14-day average of 126, 30-day average of 137.) An ACE  inhibitor/angiotensin II receptor blocker is not being taken. She does not see a podiatrist.Eye exam is current.  Hyperlipidemia This is a chronic problem. The current episode started more than 1 year ago. The problem is uncontrolled. Recent lipid tests were reviewed and are variable. Exacerbating diseases include diabetes. Factors aggravating her hyperlipidemia include fatty foods, smoking and beta blockers. Current antihyperlipidemic treatment includes statins. The current treatment provides mild improvement of lipids. Compliance problems include adherence to diet and adherence to exercise.  Risk factors for coronary artery disease include diabetes mellitus, dyslipidemia, family history, obesity, hypertension and a sedentary lifestyle.  Hypertension This is a chronic problem. The current episode started more than 1 year ago. The problem has been resolved since onset. The problem is controlled. There are no associated agents to hypertension. Risk factors for coronary artery disease include smoking/tobacco exposure, diabetes mellitus, dyslipidemia, family history, obesity and sedentary lifestyle. Past treatments include beta blockers. The current treatment provides moderate improvement. Compliance problems include diet and exercise.  Hypertensive end-organ damage includes CAD/MI.    Review of systems  Constitutional: + steadily decreasing body weight, current Body mass index is 32.04 kg/m., + fatigue-improved, no subjective hyperthermia, no subjective hypothermia Eyes: no blurry  vision, no xerophthalmia ENT: no sore throat, no nodules palpated in throat, no dysphagia/odynophagia, no hoarseness Cardiovascular: no chest pain, no shortness of breath, no palpitations, no leg swelling Respiratory: no cough, no shortness of breath Gastrointestinal: no nausea/vomiting/diarrhea Musculoskeletal: no muscle/joint aches Skin: no rashes, no hyperemia Neurological: no tremors, no numbness, no tingling, no dizziness Psychiatric: no depression, no anxiety, mood disorder (schizophrenia)- managed with meds  Objective:     BP 113/75   Pulse 70   Ht 5' 5.25" (1.657 m)   Wt 194 lb (88 kg)   BMI 32.04 kg/m   Wt Readings from Last 3 Encounters:  05/01/22 194 lb (88 kg)  01/29/22 203 lb (92.1 kg)  01/29/22 203 lb (92.1 kg)     BP Readings from Last 3 Encounters:  05/01/22 113/75  01/29/22 109/69  12/27/21 100/63     Physical Exam- Limited  Constitutional:  Body mass index is 32.04 kg/m. , not in acute distress Eyes:  EOMI, no exophthalmos Neck: Supple Cardiovascular: RRR, no murmurs, rubs, or gallops, no edema Respiratory: Adequate breathing efforts, no crackles, rales, rhonchi, or wheezing Musculoskeletal: no gross deformities, strength intact in all four extremities, no gross restriction of joint movements Skin:  no rashes, no hyperemia, multiple skin lesions to upper extremities in various stages of healing, nicotinic discoloration to fingernails bilaterally Neurological: no tremor with outstretched hands    CMP ( most recent) CMP     Component Value Date/Time   NA 139 04/24/2018 0938   K 4.4 04/24/2018 0938   CL 103 04/24/2018 0938   CO2 26 04/24/2018 0938   GLUCOSE 126 (H) 04/24/2018 0938   BUN 8 01/24/2022 0000   CREATININE 0.8 01/24/2022 0000   CREATININE 0.80 04/24/2018 0938   CALCIUM 9.5 04/24/2018 0938   PROT 8.5 (H) 09/21/2015 1319   ALBUMIN 4.5 09/21/2015 1319   AST 27 09/21/2015 1319   ALT 30 09/21/2015 1319   ALKPHOS 116 09/21/2015 1319    BILITOT 0.5 09/21/2015 1319   GFRNONAA 96 06/29/2021 0000   GFRAA >60 04/24/2018 0938     Diabetic Labs (most recent): Lab Results  Component Value Date   HGBA1C 9.0 (A) 01/29/2022   HGBA1C 8.8 01/24/2022   HGBA1C 8.8 11/01/2021     Lipid Panel (  most recent) Lipid Panel     Component Value Date/Time   TRIG 210 (A) 01/24/2022 0000   LDLCALC 68 01/24/2022 0000      Lab Results  Component Value Date   TSH 1.88 06/29/2021           Assessment & Plan:   1) Type 2 diabetes mellitus without complication, without long-term current use of insulin ([place])  She presents today with her meter and logs showing improving, slightly above target glycemic profile.  Her POCT A1c today is 7.2%, improving from last visit of 8.8%.  She denies any significant hypoglycemia.  She has been working hard on diet and incorporating more exercise into her daily routine.  Analysis of her meter shows 7-day average of 130, 14-day average of 126, 30-day average of 137.  - Ann [name] has currently uncontrolled symptomatic type 2 DM since 51 years of age.   -Recent labs reviewed.  - I had a long discussion with her about the progressive nature of diabetes and the pathology behind its complications. -her diabetes is complicated by CAD, smoking and she remains at a high risk for more acute and chronic complications which include CAD, CVA, CKD, retinopathy, and neuropathy. These are all discussed in detail with her.  - Nutritional counseling repeated at each appointment due to patients tendency to fall back in to old habits.  - The patient admits there is a room for improvement in their diet and drink choices. -  Suggestion is made for the patient to avoid simple carbohydrates from their diet including Cakes, Sweet Desserts / Pastries, Ice Cream, Soda (diet and regular), Sweet Tea, Candies, Chips, Cookies, Sweet Pastries, Store Bought Juices, Alcohol in Excess of 1-2 drinks a day, Artificial Sweeteners,  Coffee Creamer, and "Sugar-free" Products. This will help patient to have stable blood glucose profile and potentially avoid unintended weight gain.   - I encouraged the patient to switch to unprocessed or minimally processed complex starch and increased protein intake (animal or plant source), fruits, and vegetables.   - Patient is advised to stick to a routine mealtimes to eat 3 meals a day and avoid unnecessary snacks (to snack only to correct hypoglycemia).  - she will be scheduled with Jearld Fenton, RDN, CDE for diabetes education-sees her after her appointment with me today.  - I have approached her with the following individualized plan to manage her diabetes and patient agrees:   -She is advised to continue Metformin 1000 mg po twice daily with meals and Glipizide 5 mg XL daily with breakfast.   -she is encouraged to continue monitoring glucose twice daily, before breakfast and before bed, and to call the clinic if she has readings less than 70 or above 200 for 3 tests in a row.   - Adjustment parameters are given to her for hypo and hyperglycemia in writing.  - she is not an ideal candidate for incretin therapy due to hypertriglyceridemia and heavy smoking history increasing her risk of pancreatitis.  - Specific targets for  A1c; LDL, HDL, and Triglycerides were discussed with the patient.  2) Blood Pressure /Hypertension:  her blood pressure is controlled to target.   she is advised to continue her current medications including Atenolol 25 mg p.o. daily with breakfast.  3) Lipids/Hyperlipidemia:    Review of her recent lipid panel from 01/24/22 showed controlled LDL at 68 and elevated triglycerides of 210 (improving) .  she is advised to continue Crestor 40 mg daily at bedtime.  Side  effects and precautions discussed with her.  4)  Weight/Diet:  her Body mass index is 32.04 kg/m.  -  clearly complicating her diabetes care.   she is a candidate for weight loss. I discussed with  her the fact that loss of 5 - 10% of her  current body weight will have the most impact on her diabetes management.  Exercise, and detailed carbohydrates information provided  -  detailed on discharge instructions.  5) Chronic Care/Health Maintenance: -she is not on ACEI/ARB and is on Statin medications and is encouraged to initiate and continue to follow up with Ophthalmology, Dentist, Podiatrist at least yearly or according to recommendations, and advised to [place]. I have recommended yearly flu vaccine and pneumonia vaccine at least every 5 years; moderate intensity exercise for up to 150 minutes weekly; and sleep for at least 7 hours a day.  - she is advised to maintain close follow up with Coolidge Breeze, FNP for primary care needs, as well as her other providers for optimal and coordinated care.  I have requested the patient reach out to her PCP to have recent labs faxed to [place] for our records.      I spent 43 minutes in the care of the patient today including review of labs from [facility], Lipids, Thyroid Function, Hematology (current and previous including abstractions from other facilities); face-to-face time discussing  her blood glucose readings/logs, discussing hypoglycemia and hyperglycemia episodes and symptoms, medications doses, her options of short and long term treatment based on the latest standards of care / guidelines;  discussion about incorporating lifestyle medicine;  and documenting the encounter.    Please refer to Patient Instructions for Blood Glucose Monitoring and Insulin/Medications Dosing Guide"  in media tab for additional information. Please  also refer to " Patient Self Inventory" in the Media  tab for reviewed elements of pertinent patient history.  Ann [name] participated in the discussions, expressed understanding, and voiced agreement with the above plans.  All questions were answered to her satisfaction. she is encouraged to contact clinic should she have  any questions or concerns prior to her return visit.     Follow up plan: - Return in about 4 months (around 08/31/2022) for Diabetes F/U with A1c in office, Bring meter and logs, No previsit labs.    Rayetta Pigg, [facility] [address] [place], [place] [address] Phone: [phone] Fax: [phone]  05/01/2022, 9:29 AM

## 2022-05-02 ENCOUNTER — Encounter: Payer: Self-pay | Admitting: Nutrition

## 2022-07-04 ENCOUNTER — Other Ambulatory Visit: Payer: Self-pay

## 2022-07-04 ENCOUNTER — Encounter
Admission: RE | Admit: 2022-07-04 | Discharge: 2022-07-04 | Disposition: A | Discharge status: Home / Self Care | Payer: Medicare Other | Source: Ambulatory Visit | Attending: Otolaryngology | Admitting: Otolaryngology

## 2022-07-04 DIAGNOSIS — Z01812 Encounter for preprocedural laboratory examination: Secondary | ICD-10-CM

## 2022-07-04 HISTORY — DX: Atherosclerotic heart disease of native coronary artery without angina pectoris: I25.10

## 2022-07-04 HISTORY — DX: Headache, unspecified: R51.9

## 2022-07-04 NOTE — Patient Instructions (Addendum)
Your procedure is scheduled on: 07/05/22  Report to the Registration Desk on the 1st floor of the [facility]. To find out your arrival time, please call [phone] between 1PM - 3PM on: 07/04/22  If your arrival time is 6:00 am, do not arrive prior to that time as the [place] [facility] entrance doors do not open until 6:00 am.  REMEMBER: Instructions that are not followed completely may result in serious medical risk, up to and including death; or upon the discretion of your surgeon and anesthesiologist your surgery may need to be rescheduled.  Follow Bowel prep given to you by [name], DO.  TAKE THESE MEDICATIONS THE MORNING OF SURGERY WITH A SIP OF WATER:  - atenolol (TENORMIN)  - omeprazole (PRILOSEC) 20 (take one the night before and one on the morning of surgery - helps to prevent nausea after surgery.) - topiramate (TOPAMAX) - VRAYLAR - fluticasone (FLONASE)   STOP metFORMIN (GLUCOPHAGE) beginning today, may resume the day after your procedure.   One week prior to surgery: Stop Anti-inflammatories (NSAIDS) such as Advil, Aleve, Ibuprofen, Motrin, Naproxen, Naprosyn and Aspirin based products such as Excedrin, Goodys Powder, BC Powder.  Stop ANY OVER THE COUNTER supplements until after surgery.  You may however, continue to take Tylenol if needed for pain up until the day of surgery.  No Alcohol for 24 hours before or after surgery.  No Smoking including e-cigarettes for 24 hours prior to surgery.  No chewable tobacco products for at least 6 hours prior to surgery.  No nicotine patches on the day of surgery.  Do not use any "recreational" drugs for at least a week prior to your surgery.  Please be advised that the combination of cocaine and anesthesia may have negative outcomes, up to and including death. If you test positive for cocaine, your surgery will be cancelled.  On the morning of surgery brush your teeth with toothpaste and water, you may rinse your  mouth with mouthwash if you wish. Do not swallow any toothpaste or mouthwash.  Do not wear jewelry, make-up, hairpins, clips or nail polish.  Do not wear lotions, powders, or perfumes.   Do not shave body from the neck down 48 hours prior to surgery just in case you cut yourself which could leave a site for infection.  Also, freshly shaved skin may become irritated if using the CHG soap.  Contact lenses, hearing aids and dentures may not be worn into surgery.  Do not bring valuables to the hospital. [facility] is not responsible for any missing/lost belongings or valuables.   Notify your doctor if there is any change in your medical condition (cold, fever, infection).  Wear comfortable clothing (specific to your surgery type) to the hospital.  After surgery, you can help prevent lung complications by doing breathing exercises.  Take deep breaths and cough every 1-2 hours. Your doctor may order a device called an Incentive Spirometer to help you take deep breaths. When coughing or sneezing, hold a pillow firmly against your incision with both hands. This is called "splinting." Doing this helps protect your incision. It also decreases belly discomfort.  If you are being admitted to the hospital overnight, leave your suitcase in the car. After surgery it may be brought to your room.  If you are being discharged the day of surgery, you will not be allowed to drive home. You will need a responsible adult (18 years or older) to drive you home and stay with you  that night.   If you are taking public transportation, you will need to have a responsible adult (18 years or older) with you. Please confirm with your physician that it is acceptable to use public transportation.   Please call the [facility]. at [phone] if you have any questions about these instructions.  Surgery Visitation Policy:  Patients undergoing a surgery or procedure may have two family members or  support persons with them as long as the person is not COVID-19 positive or experiencing its symptoms.   Inpatient Visitation:    Visiting hours are 7 a.m. to 8 p.m. Up to four visitors are allowed at one time in a patient room, including children. The visitors may rotate out with other people during the day. One designated support person (adult) may remain overnight.

## 2022-07-05 ENCOUNTER — Encounter
Admission: RE | Disposition: A | Discharge status: Home / Self Care | Payer: Self-pay | Source: Ambulatory Visit | Attending: Gastroenterology

## 2022-07-05 ENCOUNTER — Ambulatory Visit
Admission: RE | Admit: 2022-07-05 | Discharge: 2022-07-05 | Disposition: A | Discharge status: Home / Self Care | Payer: Medicare Other | Source: Ambulatory Visit | Attending: Gastroenterology | Admitting: Gastroenterology

## 2022-07-05 ENCOUNTER — Ambulatory Visit: Payer: Medicare Other | Admitting: Anesthesiology

## 2022-07-05 DIAGNOSIS — K529 Noninfective gastroenteritis and colitis, unspecified: Secondary | ICD-10-CM | POA: Insufficient documentation

## 2022-07-05 DIAGNOSIS — E785 Hyperlipidemia, unspecified: Secondary | ICD-10-CM | POA: Insufficient documentation

## 2022-07-05 DIAGNOSIS — Z01812 Encounter for preprocedural laboratory examination: Secondary | ICD-10-CM

## 2022-07-05 DIAGNOSIS — D122 Benign neoplasm of ascending colon: Secondary | ICD-10-CM | POA: Insufficient documentation

## 2022-07-05 DIAGNOSIS — D124 Benign neoplasm of descending colon: Secondary | ICD-10-CM | POA: Insufficient documentation

## 2022-07-05 DIAGNOSIS — I2510 Atherosclerotic heart disease of native coronary artery without angina pectoris: Secondary | ICD-10-CM | POA: Diagnosis not present

## 2022-07-05 DIAGNOSIS — D125 Benign neoplasm of sigmoid colon: Secondary | ICD-10-CM | POA: Insufficient documentation

## 2022-07-05 DIAGNOSIS — K2100 Gastro-esophageal reflux disease with esophagitis, without bleeding: Secondary | ICD-10-CM | POA: Diagnosis not present

## 2022-07-05 DIAGNOSIS — F17200 Nicotine dependence, unspecified, uncomplicated: Secondary | ICD-10-CM | POA: Insufficient documentation

## 2022-07-05 DIAGNOSIS — K317 Polyp of stomach and duodenum: Secondary | ICD-10-CM | POA: Insufficient documentation

## 2022-07-05 DIAGNOSIS — E119 Type 2 diabetes mellitus without complications: Secondary | ICD-10-CM | POA: Diagnosis not present

## 2022-07-05 DIAGNOSIS — Z79899 Other long term (current) drug therapy: Secondary | ICD-10-CM | POA: Insufficient documentation

## 2022-07-05 DIAGNOSIS — B078 Other viral warts: Secondary | ICD-10-CM | POA: Insufficient documentation

## 2022-07-05 DIAGNOSIS — K2950 Unspecified chronic gastritis without bleeding: Secondary | ICD-10-CM | POA: Insufficient documentation

## 2022-07-05 DIAGNOSIS — F319 Bipolar disorder, unspecified: Secondary | ICD-10-CM | POA: Diagnosis not present

## 2022-07-05 HISTORY — PX: COLONOSCOPY: SHX5424

## 2022-07-05 HISTORY — PX: ESOPHAGOGASTRODUODENOSCOPY: SHX5428

## 2022-07-05 HISTORY — PX: EXCISION NASAL MASS: SHX6271

## 2022-07-05 LAB — GLUCOSE, CAPILLARY
Glucose-Capillary: 185 mg/dL — ABNORMAL HIGH (ref 70–99)
Glucose-Capillary: 186 mg/dL — ABNORMAL HIGH (ref 70–99)

## 2022-07-05 SURGERY — COLONOSCOPY
Anesthesia: General

## 2022-07-05 SURGERY — EXCISION, MASS, NOSE
Anesthesia: Choice | Laterality: Left

## 2022-07-05 MED ORDER — FENTANYL CITRATE (PF) 100 MCG/2ML IJ SOLN
INTRAMUSCULAR | Status: DC | PRN
  Administered 2022-07-05 (×2): 50 ug via INTRAVENOUS

## 2022-07-05 MED ORDER — DEXAMETHASONE SODIUM PHOSPHATE 10 MG/ML IJ SOLN
INTRAMUSCULAR | Status: AC
  Filled 2022-07-05: qty 1

## 2022-07-05 MED ORDER — LIDOCAINE HCL (CARDIAC) PF 100 MG/5ML IV SOSY
PREFILLED_SYRINGE | INTRAVENOUS | Status: DC | PRN
  Administered 2022-07-05: 30 mg via INTRAVENOUS

## 2022-07-05 MED ORDER — PROPOFOL 10 MG/ML IV BOLUS
INTRAVENOUS | Status: DC | PRN
  Administered 2022-07-05: 160 mg via INTRAVENOUS
  Administered 2022-07-05: 40 mg via INTRAVENOUS
  Administered 2022-07-05: 20 mg via INTRAVENOUS
  Administered 2022-07-05 (×2): 40 mg via INTRAVENOUS

## 2022-07-05 MED ORDER — ORAL CARE MOUTH RINSE: 15.0000 mL | Freq: Once | OROMUCOSAL | Status: DC

## 2022-07-05 MED ORDER — CHLORHEXIDINE GLUCONATE 0.12 % MT SOLN: 15.0000 mL | Freq: Once | OROMUCOSAL | Status: DC

## 2022-07-05 MED ORDER — LIDOCAINE HCL (PF) 2 % IJ SOLN
INTRAMUSCULAR | Status: AC
  Filled 2022-07-05: qty 5

## 2022-07-05 MED ORDER — SODIUM CHLORIDE 0.9 % IV SOLN: INTRAVENOUS | Status: DC

## 2022-07-05 MED ORDER — ONDANSETRON HCL 4 MG/2ML IJ SOLN
INTRAMUSCULAR | Status: DC | PRN
  Administered 2022-07-05: 4 mg via INTRAVENOUS

## 2022-07-05 MED ORDER — MIDAZOLAM HCL 2 MG/2ML IJ SOLN
INTRAMUSCULAR | Status: DC | PRN
  Administered 2022-07-05: 2 mg via INTRAVENOUS

## 2022-07-05 MED ORDER — MUPIROCIN 2 % EX OINT: 1.0000 | TOPICAL_OINTMENT | Freq: Three times a day (TID) | CUTANEOUS | 0 refills | Status: DC

## 2022-07-05 MED ORDER — MIDAZOLAM HCL 2 MG/2ML IJ SOLN
INTRAMUSCULAR | Status: AC
  Filled 2022-07-05: qty 2

## 2022-07-05 MED ORDER — GLYCOPYRROLATE 0.2 MG/ML IJ SOLN
INTRAMUSCULAR | Status: AC
  Filled 2022-07-05: qty 1

## 2022-07-05 MED ORDER — SUCCINYLCHOLINE CHLORIDE 200 MG/10ML IV SOSY
PREFILLED_SYRINGE | INTRAVENOUS | Status: AC
  Filled 2022-07-05: qty 10

## 2022-07-05 MED ORDER — FENTANYL CITRATE (PF) 100 MCG/2ML IJ SOLN
INTRAMUSCULAR | Status: AC
  Filled 2022-07-05: qty 2

## 2022-07-05 MED ORDER — SUCCINYLCHOLINE CHLORIDE 200 MG/10ML IV SOSY
PREFILLED_SYRINGE | INTRAVENOUS | Status: DC | PRN
  Administered 2022-07-05: 100 mg via INTRAVENOUS

## 2022-07-05 MED ORDER — DEXMEDETOMIDINE (PRECEDEX) IN NS 20 MCG/5ML (4 MCG/ML) IV SYRINGE
PREFILLED_SYRINGE | INTRAVENOUS | Status: DC | PRN
  Administered 2022-07-05: 12 ug via INTRAVENOUS

## 2022-07-05 MED ORDER — DEXAMETHASONE SODIUM PHOSPHATE 10 MG/ML IJ SOLN
INTRAMUSCULAR | Status: DC | PRN
  Administered 2022-07-05: 10 mg via INTRAVENOUS

## 2022-07-05 MED ORDER — ONDANSETRON HCL 4 MG/2ML IJ SOLN
INTRAMUSCULAR | Status: AC
  Filled 2022-07-05: qty 2

## 2022-07-05 MED ORDER — PROPOFOL 500 MG/50ML IV EMUL
INTRAVENOUS | Status: DC | PRN
  Administered 2022-07-05: 120 ug/kg/min via INTRAVENOUS

## 2022-07-05 MED ORDER — SPOT INK MARKER SYRINGE KIT
PACK | SUBMUCOSAL | Status: DC | PRN
  Administered 2022-07-05: 1.5 mL via SUBMUCOSAL

## 2022-07-05 MED ORDER — LIDOCAINE-EPINEPHRINE 1 %-1:100000 IJ SOLN: INTRAMUSCULAR | Status: DC | PRN

## 2022-07-05 MED ORDER — PROPOFOL 1000 MG/100ML IV EMUL
INTRAVENOUS | Status: AC
  Filled 2022-07-05: qty 200

## 2022-07-05 MED ORDER — GLYCOPYRROLATE 0.2 MG/ML IJ SOLN
INTRAMUSCULAR | Status: DC | PRN
  Administered 2022-07-05: .2 mg via INTRAVENOUS

## 2022-07-05 SURGICAL SUPPLY — 21 items
CNTNR SPEC 2.5X3XGRAD LEK (MISCELLANEOUS) ×1
COAG SUCT 10F 3.5MM HAND CTRL (MISCELLANEOUS) ×2 IMPLANT
CONT SPEC 4OZ STER OR WHT (MISCELLANEOUS) ×1
CONT SPEC 4OZ STRL OR WHT (MISCELLANEOUS) ×1
CONTAINER SPEC 2.5X3XGRAD LEK (MISCELLANEOUS) ×1 IMPLANT
DEFOGGER ANTIFOG KIT (MISCELLANEOUS) ×2 IMPLANT
ELECT REM PT RETURN 9FT ADLT (ELECTROSURGICAL) ×2
ELECTRODE REM PT RTRN 9FT ADLT (ELECTROSURGICAL) ×1 IMPLANT
GAUZE 4X4 16PLY ~~LOC~~+RFID DBL (SPONGE) ×2 IMPLANT
GLOVE BIO SURGEON STRL SZ7.5 (GLOVE) ×2 IMPLANT
GOWN STRL REUS W/ TWL LRG LVL3 (GOWN DISPOSABLE) ×2 IMPLANT
GOWN STRL REUS W/TWL LRG LVL3 (GOWN DISPOSABLE) ×4
KIT TURNOVER KIT A (KITS) ×2 IMPLANT
MANIFOLD NEPTUNE II (INSTRUMENTS) ×2 IMPLANT
NS IRRIG 500ML POUR BTL (IV SOLUTION) ×2 IMPLANT
PACK HEAD/NECK (MISCELLANEOUS) ×2 IMPLANT
SPONGE NEURO XRAY DETECT 1X3 (DISPOSABLE) ×2 IMPLANT
SWAB CULTURE AMIES ANAERIB BLU (MISCELLANEOUS) ×2 IMPLANT
SYR 3ML LL SCALE MARK (SYRINGE) ×2 IMPLANT
TRAP FLUID SMOKE EVACUATOR (MISCELLANEOUS) ×2 IMPLANT
WATER STERILE IRR 500ML POUR (IV SOLUTION) ×2 IMPLANT

## 2022-07-05 NOTE — Interval H&P Note (Signed)
History and Physical Interval Note: Preprocedure H&P from 07/05/22  was reviewed and there was no interval change after seeing and examining the patient.  Written consent was obtained from the patient after discussion of risks, benefits, and alternatives. Patient has consented to proceed with Esophagogastroduodenoscopy and Colonoscopy with possible intervention   07/05/2022 7:44 AM  Ann [name]  has presented today for surgery, with the diagnosis of Alternating constipation and diarrhea (R19.8) Gastroesophageal reflux disease, unspecified whether esophagitis present (K21.9) Dysphagia, unspecified type (R13.10).  The various methods of treatment have been discussed with the patient and family. After consideration of risks, benefits and other options for treatment, the patient has consented to  Procedure(s) with comments: COLONOSCOPY (N/A) - Dr. Pryor Ochoa to do excision from nose. Needs headlamp. OK'd per PM ESOPHAGOGASTRODUODENOSCOPY (EGD) (N/A) as a surgical intervention.  The patient's history has been reviewed, patient examined, no change in status, stable for surgery.  I have reviewed the patient's chart and labs.  Questions were answered to the patient's satisfaction.     [name]

## 2022-07-05 NOTE — Anesthesia Preprocedure Evaluation (Signed)
Anesthesia Evaluation  Patient identified by MRN, date of birth, ID band Patient awake    Reviewed: Allergy & Precautions, NPO status , Patient's Chart, lab work & pertinent test results  History of Anesthesia Complications (+) PROLONGED EMERGENCE and history of anesthetic complications  Airway Mallampati: III  TM Distance: <3 FB     Dental  (+) Chipped   Pulmonary Current Smoker and Patient abstained from smoking.,    Pulmonary exam normal breath sounds clear to auscultation- rhonchi       Cardiovascular Exercise Tolerance: Good + CAD  (-) Past MI, (-) Cardiac Stents and (-) CHF Normal cardiovascular exam+ dysrhythmias Supra Ventricular Tachycardia  Rhythm:Regular Rate:Normal     Neuro/Psych  Headaches, PSYCHIATRIC DISORDERS Depression Bipolar Disorder Schizophrenia    GI/Hepatic GERD  Poorly Controlled and Medicated,(+)     (-) substance abuse  , Last drug/cocaine use was in 2017   Endo/Other  negative endocrine ROSdiabetes  Renal/GU negative Renal ROS  negative genitourinary   Musculoskeletal  (+) Arthritis , Osteoarthritis,    Abdominal (+) + obese,  Abdomen: soft.    Peds negative pediatric ROS (+)  Hematology  (+) Blood dyscrasia, anemia ,   Anesthesia Other Findings Past Medical History: No date: Alcohol abuse No date: Anemia No date: Arthritis     Comment:  KNEE No date: Bipolar 1 disorder (HCC) No date: Complication of anesthesia     Comment:  HARD TO WAKE UP WITH CERVIX SURGERY No date: Coronary artery disease No date: Depression No date: Diabetes mellitus, type II (HCC) No date: Drug abuse ([place]) No date: Dysrhythmia     Comment:  SVT PER CARDIOLOGY NOTE IN 2016 No date: GERD (gastroesophageal reflux disease) No date: Headache No date: Hyperlipidemia No date: Schizophrenia ([place]) No date: Smokers' cough ([place])   Reproductive/Obstetrics                              Anesthesia Physical  Anesthesia Plan  ASA: 3  Anesthesia Plan: General   Post-op Pain Management:    Induction: Intravenous, Rapid sequence and Cricoid pressure planned  PONV Risk Score and Plan: 2 and Treatment may vary due to age or medical condition, Dexamethasone and Ondansetron  Airway Management Planned: Oral ETT  Additional Equipment: None  Intra-op Plan:   Post-operative Plan: Extubation in OR  Informed Consent: I have reviewed the patients History and Physical, chart, labs and discussed the procedure including the risks, benefits and alternatives for the proposed anesthesia with the patient or authorized representative who has indicated his/her understanding and acceptance.     Dental advisory given  Plan Discussed with: CRNA and Surgeon  Anesthesia Plan Comments: (Discussed risks of anesthesia with patient, including PONV, sore throat, lip/dental/eye damage. Rare risks discussed as well, such as cardiorespiratory and neurological sequelae, and allergic reactions. Discussed the role of CRNA in patient's perioperative care. Patient understands.)        Anesthesia Quick Evaluation  Patient Active Problem List   Diagnosis Date Noted  . Thickened endometrium 04/28/2018  . Postmenopausal bleeding 04/28/2018       Latest Ref Rng & Units 04/24/2018    9:38 AM 09/21/2015    1:19 PM  CBC  WBC 3.6 - 11.0 K/uL 10.0  9.6   Hemoglobin 12.0 - 16.0 g/dL 14.8  14.2   Hematocrit 35.0 - 47.0 % 42.4  43.0   Platelets 150 - 440 K/uL 367  370  Latest Ref Rng & Units 01/24/2022   12:00 AM 06/29/2021   12:00 AM 04/24/2018    9:38 AM  BMP  Glucose 65 - 99 mg/dL   126   BUN 4 - '21 8     6     6   '$ Creatinine 0.5 - 1.1 0.8     0.8     0.80   Sodium 135 - 145 mmol/L   139   Potassium 3.5 - 5.1 mmol/L   4.4   Chloride 101 - 111 mmol/L   103   CO2 22 - 32 mmol/L   26   Calcium 8.9 - 10.3 mg/dL   9.5      This result is from an external source.     Risks and benefits of anesthesia discussed at length, patient or surrogate demonstrates understanding. Appropriately NPO. Plan to proceed with anesthesia.  Champ Mungo, MD 07/05/22

## 2022-07-05 NOTE — Anesthesia Postprocedure Evaluation (Signed)
Anesthesia Post Note  Patient: Ann [name]  Procedure(s) Performed: COLONOSCOPY ESOPHAGOGASTRODUODENOSCOPY (EGD)  Patient location during evaluation: Endoscopy Anesthesia Type: General Level of consciousness: awake and alert Pain management: pain level controlled Vital Signs Assessment: post-procedure vital signs reviewed and stable Respiratory status: spontaneous breathing, nonlabored ventilation, respiratory function stable and patient connected to nasal cannula oxygen Cardiovascular status: blood pressure returned to baseline and stable Postop Assessment: no apparent nausea or vomiting Anesthetic complications: no   No notable events documented.   Last Vitals:  Vitals:   07/05/22 0923 07/05/22 0928  BP: 117/75 (!) 103/59  Resp:    Temp:    SpO2:  96%    Last Pain:  Vitals:   07/05/22 0923  TempSrc:   PainSc: 0-No pain                 Arita Miss

## 2022-07-05 NOTE — Op Note (Signed)
[facility] Gastroenterology Patient Name: Ann [name] Procedure Date: 07/05/2022 7:38 AM MRN: [id number] Account #: [id number] Date of Birth: 1971/01/14 Admit Type: Outpatient Age: 51 Room: [facility] ENDO ROOM 1 Gender: Female Note Status: Finalized Instrument Name: Upper Endoscope [id number] Procedure:             Upper GI endoscopy Indications:           Esophageal reflux, Diarrhea Providers:             [name] DO, DO Referring MD:          No Local Md, MD (Referring MD) Medicines:             General Anesthesia Complications:         No immediate complications. Estimated blood loss:                         Minimal. Procedure:             Pre-Anesthesia Assessment:                        - Prior to the procedure, a History and Physical was                         performed, and patient medications and allergies were                         reviewed. The patient is competent. The risks and                         benefits of the procedure and the sedation options and                         risks were discussed with the patient. All questions                         were answered and informed consent was obtained.                         Patient identification and proposed procedure were                         verified by the physician, the nurse, the anesthetist                         and the technician in the endoscopy suite. Mental                         Status Examination: alert and oriented. Airway                         Examination: normal oropharyngeal airway and neck                         mobility. Respiratory Examination: clear to                         auscultation. CV Examination: RRR, no murmurs, no S3  or S4. Prophylactic Antibiotics: The patient does not                         require prophylactic antibiotics. Prior                         Anticoagulants: The patient has taken no previous                          anticoagulant or antiplatelet agents. ASA Grade                         Assessment: III - A patient with severe systemic                         disease. After reviewing the risks and benefits, the                         patient was deemed in satisfactory condition to                         undergo the procedure. The anesthesia plan was to use                         general anesthesia. Immediately prior to                         administration of medications, the patient was                         re-assessed for adequacy to receive sedatives. The                         heart rate, respiratory rate, oxygen saturations,                         blood pressure, adequacy of pulmonary ventilation, and                         response to care were monitored throughout the                         procedure. The physical status of the patient was                         re-assessed after the procedure.                        After obtaining informed consent, the endoscope was                         passed under direct vision. Throughout the procedure,                         the patient's blood pressure, pulse, and oxygen                         saturations were monitored continuously. The Endoscope  was introduced through the mouth, and advanced to the                         second part of duodenum. The upper GI endoscopy was                         accomplished without difficulty. The patient tolerated                         the procedure well. Findings:      The duodenal bulb, first portion of the duodenum and second portion of       the duodenum were normal. Biopsies for histology were taken with a cold       forceps for evaluation of celiac disease. Estimated blood loss was       minimal.      The entire examined stomach was normal. Large amounts of bile in the       stomach and refluxing into the esophagus. Biopsies were taken with a       cold forceps  for Helicobacter pylori testing. Estimated blood loss was       minimal.      Esophagogastric landmarks were identified: the gastroesophageal junction       was found at 37 cm from the incisors.      The Z-line was irregular with tongue ~1 cm above. Biopsies were taken       with a cold forceps for histology. Estimated blood loss was minimal.      Normal mucosa was found in the entire esophagus. Biopsies were taken       with a cold forceps for histology. Taken from mid esophagus. Estimated       blood loss was minimal. The scope was withdrawn. Dilation was performed       with a Maloney dilator with no resistance at 52 Fr. The dilation site       was examined following endoscope reinsertion and showed no change.       Estimated blood loss was minimal.      The exam was otherwise without abnormality.      A few 1 to 2 mm sessile polyps with no bleeding and no stigmata of       recent bleeding were found in the gastric body. The polyp was removed       with a cold biopsy forceps. Resection and retrieval were complete.       Estimated blood loss was minimal. Impression:            - Normal duodenal bulb, first portion of the duodenum                         and second portion of the duodenum. Biopsied.                        - Normal stomach. Biopsied.                        - Esophagogastric landmarks identified.                        - Z-line irregular with tongue ~1 cm above. Biopsied.                        -  Normal mucosa was found in the entire esophagus.                         Biopsied. Dilated.                        - The examination was otherwise normal.                        - A few gastric polyps. Resected and retrieved. Recommendation:        - Patient has a contact number available for                         emergencies. The signs and symptoms of potential                         delayed complications were discussed with the patient.                         Return to  normal activities tomorrow. Written                         discharge instructions were provided to the patient.                        - Discharge patient to home.                        - Moisten and chew food well.                        - Resume previous diet.                        - Continue present medications.                        - Await pathology results.                        - Return to GI clinic as previously scheduled.                        - proceed with colonoscopy.                        - No aspirin, ibuprofen, naproxen, or other                         non-steroidal anti-inflammatory drugs for 5 days after                         biopsy.                        - The findings and recommendations were discussed with                         the patient. Procedure Code(s):     --- Professional ---  14239, Esophagogastroduodenoscopy, flexible,                         transoral; with biopsy, single or multiple                        43450, Dilation of esophagus, by unguided sound or                         bougie, single or multiple passes Diagnosis Code(s):     --- Professional ---                        K31.7, Polyp of stomach and duodenum                        K21.9, Gastro-esophageal reflux disease without                         esophagitis                        R19.7, Diarrhea, unspecified CPT copyright 2019 [employer]. All rights reserved. The codes documented in this report are preliminary and upon coder review may  be revised to meet current compliance requirements. Attending Participation:      I personally performed the entire procedure. Volney American, DO [name] DO, DO 07/05/2022 8:15:04 AM This report has been signed electronically. Number of Addenda: 0 Note Initiated On: 07/05/2022 7:38 AM Estimated Blood Loss:  Estimated blood loss was minimal.      [facility]

## 2022-07-05 NOTE — Op Note (Signed)
[facility] Patient Name: Ann [name] Procedure Date: 07/05/2022 7:37 AM MRN: [id number] Account #: [id number] Date of Birth: [birth year] Admit Type: Outpatient Age: 51 Room: [facility] ENDO ROOM 1 Gender: Female Note Status: Finalized Instrument Name: Colonoscope [id number] Procedure:             Colonoscopy Indications:           Chronic diarrhea Providers:             [name] DO, DO Referring MD:          No Local Md, MD (Referring MD) Medicines:             Monitored Anesthesia Care Complications:         No immediate complications. Estimated blood loss:                         Minimal. Procedure:             Pre-Anesthesia Assessment:                        - Prior to the procedure, a History and Physical was                         performed, and patient medications and allergies were                         reviewed. The patient is competent. The risks and                         benefits of the procedure and the sedation options and                         risks were discussed with the patient. All questions                         were answered and informed consent was obtained.                         Patient identification and proposed procedure were                         verified by the physician, the nurse, the anesthetist                         and the technician in the endoscopy suite. Mental                         Status Examination: alert and oriented. Airway                         Examination: normal oropharyngeal airway and neck                         mobility. Respiratory Examination: clear to                         auscultation. CV Examination: RRR, no murmurs, no S3  or S4. Prophylactic Antibiotics: The patient does not                         require prophylactic antibiotics. Prior                         Anticoagulants: The patient has taken no previous                          anticoagulant or antiplatelet agents. ASA Grade                         Assessment: III - A patient with severe systemic                         disease. After reviewing the risks and benefits, the                         patient was deemed in satisfactory condition to                         undergo the procedure. The anesthesia plan was to use                         monitored anesthesia care (MAC). Immediately prior to                         administration of medications, the patient was                         re-assessed for adequacy to receive sedatives. The                         heart rate, respiratory rate, oxygen saturations,                         blood pressure, adequacy of pulmonary ventilation, and                         response to care were monitored throughout the                         procedure. The physical status of the patient was                         re-assessed after the procedure.                        After obtaining informed consent, the colonoscope was                         passed under direct vision. Throughout the procedure,                         the patient's blood pressure, pulse, and oxygen                         saturations were monitored continuously. The  Colonoscope was introduced through the anus and                         advanced to the the terminal ileum, with                         identification of the appendiceal orifice and IC                         valve. The colonoscopy was performed without                         difficulty. The patient tolerated the procedure well.                         The quality of the bowel preparation was evaluated                         using the BBPS [facility] Bowel Preparation Scale) with                         scores of: Right Colon = 3, Transverse Colon = 3 and                         Left Colon = 3 (entire mucosa seen well with no                         residual staining,  small fragments of stool or opaque                         liquid). The total BBPS score equals 9. The terminal                         ileum, ileocecal valve, appendiceal orifice, and                         rectum were photographed. Findings:      The perianal and digital rectal examinations were normal. Pertinent       negatives include normal sphincter tone.      The terminal ileum appeared normal. Estimated blood loss: none.      A 19 to 21 mm polyp was found in the ascending colon. The polyp was       sessile. Appeared to be overlying lipoma. Area was successfully injected       with 5 mL Eleview for a lift polypectomy. Estimated blood loss was       minimal. The polyp was removed with a hot snare. Resection and retrieval       were complete. Estimated blood loss was minimal. Area was tattooed with       an injection of 1 mL of [place] ink. tattoo'd just distal To prevent       bleeding after the polypectomy, one hemostatic clip was successfully       placed (MR conditional). There was no bleeding at the end of the       procedure. Estimated blood loss: none.      Two sessile polyps were found in the sigmoid colon and descending colon.  The polyps were 3 to 4 mm in size. These polyps were removed with a cold       snare. Resection and retrieval were complete. Estimated blood loss was       minimal.      A 6 to 7 mm polyp was found in the rectum. The polyp was pedunculated.       The polyp was removed with a hot snare. Resection and retrieval were       complete. Estimated blood loss was minimal.      A 1 to 2 mm polyp was found in the rectum. The polyp was sessile. The       polyp was removed with a jumbo cold forceps. Resection and retrieval       were complete. Estimated blood loss was minimal.      The exam was otherwise without abnormality on direct and retroflexion       views. Impression:            - The examined portion of the ileum was normal.                        -  One 19 to 21 mm polyp in the ascending colon,                         removed with a hot snare. Resected and retrieved.                         Injected. Tattooed. Clip (MR conditional) was placed.                        - Two 3 to 4 mm polyps in the sigmoid colon and in the                         descending colon, removed with a cold snare. Resected                         and retrieved.                        - One 6 to 7 mm polyp in the rectum, removed with a                         hot snare. Resected and retrieved.                        - One 1 to 2 mm polyp in the rectum, removed with a                         jumbo cold forceps. Resected and retrieved.                        - The examination was otherwise normal on direct and                         retroflexion views. Recommendation:        - Discharge patient to home.                        -  Patient has a contact number available for                         emergencies. The signs and symptoms of potential                         delayed complications were discussed with the patient.                         Return to normal activities tomorrow. Written                         discharge instructions were provided to the patient.                        - Resume previous diet.                        - Continue present medications.                        - No aspirin, ibuprofen, naproxen, or other                         non-steroidal anti-inflammatory drugs for 5 days after                         polyp removal.                        - Await pathology results.                        - Repeat colonoscopy in 6 months for surveillance                         after piecemeal polypectomy.                        - Return to GI office as previously scheduled.                        - The findings and recommendations were discussed with                         the patient. Procedure Code(s):     --- Professional ---                         [phone], Colonoscopy, flexible; with removal of                         tumor(s), polyp(s), or other lesion(s) by snare                         technique                        45381, Colonoscopy, flexible; with directed submucosal                         injection(s), any substance  85462, 69, Colonoscopy, flexible; with biopsy, single                         or multiple Diagnosis Code(s):     --- Professional ---                        K63.5, Polyp of colon                        K62.1, Rectal polyp                        K52.9, Noninfective gastroenteritis and colitis,                         unspecified CPT copyright 2019 American Medical Association. All rights reserved. The codes documented in this report are preliminary and upon coder review may  be revised to meet current compliance requirements. Attending Participation:      I personally performed the entire procedure. Volney American, DO [name] DO, DO 07/05/2022 9:13:06 AM This report has been signed electronically. Number of Addenda: 0 Note Initiated On: 07/05/2022 7:37 AM Scope Withdrawal Time: 0 hours 43 minutes 1 second  Total Procedure Duration: 0 hours 46 minutes 18 seconds  Estimated Blood Loss:  Estimated blood loss was minimal.      [facility]

## 2022-07-05 NOTE — Op Note (Signed)
..  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  Procedure: After a proper time out was preformed, with the patient in a comfortable supine position, general was administered.  At an appropriate level, anterior rhinoscopy was performed using a nasal speculum.  This demonstrated a broad based papillomatous mass emanting from the superior aspect of the patients' left nasal cavity.  This was gently exposed with the nasal speculum and using a 15 blade scalpel was circumferentially excised in 2 pieces.  Hemostasis was achieved using silver nitrate cautery.  No other masses were identified.  Following this  The patient was returned to anesthesia, where her GI procedure was completed.  Dispo:  Care of patient transferred to GI for endoscopic procedure  Plan: Mupirocin ointment TID x 10 days.  Recheck my office three weeks.  Follow pathology   Ann [name] 8:00 AM 07/05/2022

## 2022-07-05 NOTE — Anesthesia Procedure Notes (Signed)
Procedure Name: Intubation Date/Time: 07/05/2022 7:54 AM  Performed by: Arita Miss, MDPre-anesthesia Checklist: Patient identified, Patient being monitored, Timeout performed, Emergency Drugs available and Suction available Patient Re-evaluated:Patient Re-evaluated prior to induction Oxygen Delivery Method: Circle system utilized Preoxygenation: Pre-oxygenation with 100% oxygen Induction Type: IV induction Ventilation: Mask ventilation without difficulty Laryngoscope Size: 3 and McGraph Grade View: Grade I Tube type: Oral Tube size: 6.5 mm Number of attempts: 1 Airway Equipment and Method: Stylet and Video-laryngoscopy Placement Confirmation: ETT inserted through vocal cords under direct vision, positive ETCO2 and breath sounds checked- equal and bilateral Secured at: 20 cm Tube secured with: Tape Dental Injury: Teeth and Oropharynx as per pre-operative assessment

## 2022-07-05 NOTE — H&P (Signed)
..  History and Physical paper copy reviewed and updated date of procedure and will be scanned into system.  Patient seen and examined.  Left nasal cavity marked.

## 2022-07-05 NOTE — Transfer of Care (Signed)
Immediate Anesthesia Transfer of Care Note  Patient: Ann [name]  Procedure(s) Performed: COLONOSCOPY ESOPHAGOGASTRODUODENOSCOPY (EGD)  Patient Location: PACU  Anesthesia Type:General  Level of Consciousness: sedated  Airway & Oxygen Therapy: Patient Spontanous Breathing and Patient connected to face mask oxygen  Post-op Assessment: Report given to RN and Post -op Vital signs reviewed and stable  Post vital signs: Reviewed  Last Vitals:  Vitals Value Taken Time  BP 100/68 07/05/22 0914  Temp    Pulse 71 07/05/22 0914  Resp 18 07/05/22 0914  SpO2 96 % 07/05/22 0914  Vitals shown include unvalidated device data.  Last Pain:  Vitals:   07/05/22 0727  TempSrc: Oral         Complications: No notable events documented.

## 2022-07-05 NOTE — H&P (Signed)
Pre-Procedure H&P   Patient ID: Ann [name] is a 51 y.o. female.  Gastroenterology Provider: [name], DO  Referring Provider: Dawson Bills, NP PCP: Coolidge Breeze, FNP  Date: 07/05/2022  HPI Ann [name] is a 51 y.o. female who presents today for Esophagogastroduodenoscopy and Colonoscopy for Dysphagia, alternating diarrhea and constipation; fhx polyps. Patient with alternating diarrhea and constipation since her cholecystectomy in July 2021.  Also notes abdominal bloating.  She is anywhere from 1 formed bowel movement to 2-3 looser bowel movements per day.  Denies melena or hematochezia.  Mother does have a history of colon polyps. Does note dysphagia without odynophagia to breads.  No nausea vomiting weight loss or changes in appetite.  Patient having nasal polyp removed prior to egd/colonoscopy.  DM2 with A1c improved from 9-7.2 most recent hemoglobin 13.8 MCV 96 platelets 220,000 creatinine 0.8 Active tobacco use 2 packs/day.  Alcohol use-6 beers per day quit 6 to 7 years ago per note No family history of colon cancer  Past Medical History:  Diagnosis Date   Alcohol abuse    Anemia    Arthritis    KNEE   Bipolar 1 disorder ([place])    Complication of anesthesia    HARD TO WAKE UP WITH CERVIX SURGERY   Coronary artery disease    Depression    Diabetes mellitus, type II ([place])    Drug abuse ([place])    Dysrhythmia    SVT PER CARDIOLOGY NOTE IN 2016   GERD (gastroesophageal reflux disease)    Headache    Hyperlipidemia    Schizophrenia ([place])    Smokers' cough ([place])     Past Surgical History:  Procedure Laterality Date   CERVIX SURGERY     EXCISION ORAL TUMOR Right 12/27/2021   Procedure: EXCISION OF SOFT PALATE MASS;  Surgeon: Carloyn Manner, MD;  Location: [place];  Service: ENT;  Laterality: Right;  Diabetic   GALLBLADDER SURGERY  06/15/2020   HYSTEROSCOPY WITH D & C N/A 04/28/2018   Procedure: DILATATION AND CURETTAGE  /HYSTEROSCOPY;  Surgeon: Ward, Honor Loh, MD;  Location: [facility];  Service: Gynecology;  Laterality: N/A;   POLYPECTOMY  04/28/2018   Procedure: POLYPECTOMY;  Surgeon: Ward, Honor Loh, MD;  Location: [facility];  Service: Gynecology;;   TUBAL LIGATION      Family History Mother- colon polyps No h/o GI disease or malignancy  Review of Systems  Constitutional:  Negative for activity change, appetite change, chills, diaphoresis, fatigue, fever and unexpected weight change.  HENT:  Positive for trouble swallowing. Negative for voice change.   Respiratory:  Negative for shortness of breath and wheezing.   Cardiovascular:  Negative for chest pain, palpitations and leg swelling.  Gastrointestinal:  Positive for abdominal distention (bloating), constipation and diarrhea. Negative for abdominal pain, anal bleeding, blood in stool, nausea, rectal pain and vomiting.  Musculoskeletal:  Negative for arthralgias and myalgias.  Skin:  Negative for color change and pallor.  Neurological:  Negative for dizziness, syncope and weakness.  Psychiatric/Behavioral:  Negative for confusion.   All other systems reviewed and are negative.    Medications No current facility-administered medications on file prior to encounter.   Current Outpatient Medications on File Prior to Encounter  Medication Sig Dispense Refill   atenolol (TENORMIN) 25 MG tablet Take 25 mg by mouth daily.     gabapentin (NEURONTIN) 300 MG capsule Take 300 mg by mouth as needed.     Multiple Vitamins-Minerals (CENTRUM SILVER ADULT 50+  PO) Take by mouth daily.     omeprazole (PRILOSEC) 20 MG capsule Take 40 mg by mouth every morning.      rosuvastatin (CRESTOR) 40 MG tablet Take 40 mg by mouth daily.     topiramate (TOPAMAX) 50 MG tablet Take 50 mg by mouth 2 (two) times daily.     VRAYLAR 3 MG capsule Take 3 mg by mouth daily.     GNP ASPIRIN LOW DOSE 81 MG EC tablet Take 81 mg by mouth daily.     haloperidol lactate (HALDOL) 5 MG/ML  injection Inject 100 mg into the muscle every 30 (thirty) days.      mupirocin ointment (BACTROBAN) 2 % SMARTSIG:1 Application Topical 2-3 Times Daily     TRUE METRIX BLOOD GLUCOSE TEST test strip SMARTSIG:Via Meter     TRUEplus Lancets 33G MISC SMARTSIG:1 Topical Daily      Pertinent medications related to GI and procedure were reviewed by me with the patient prior to the procedure   Current Facility-Administered Medications:    0.9 %  sodium chloride infusion, , Intravenous, Continuous, Arita Miss, MD   chlorhexidine (PERIDEX) 0.12 % solution 15 mL, 15 mL, Mouth/Throat, Once **OR** Oral care mouth rinse, 15 mL, Mouth Rinse, Once, Arita Miss, MD      No Known Allergies Allergies were reviewed by me prior to the procedure  Objective   Body mass index is 32.62 kg/m. Vitals:   07/05/22 0727  BP: 127/77  Resp: 20  Temp: 97.8 F (36.6 C)  TempSrc: Oral  SpO2: 97%  Weight: 88.9 kg  Height: '5\' 5"'$  (1.651 m)     Physical Exam Vitals and nursing note reviewed.  Constitutional:      General: She is not in acute distress.    Appearance: Normal appearance. She is obese. She is not ill-appearing, toxic-appearing or diaphoretic.  HENT:     Head: Normocephalic and atraumatic.     Nose: Nose normal.     Mouth/Throat:     Mouth: Mucous membranes are moist.     Pharynx: Oropharynx is clear.  Eyes:     General: No scleral icterus.    Extraocular Movements: Extraocular movements intact.  Cardiovascular:     Rate and Rhythm: Normal rate and regular rhythm.     Heart sounds: Normal heart sounds. No murmur heard.    No friction rub. No gallop.  Pulmonary:     Effort: Pulmonary effort is normal. No respiratory distress.     Breath sounds: Normal breath sounds. No wheezing, rhonchi or rales.  Abdominal:     General: Bowel sounds are normal. There is no distension.     Palpations: Abdomen is soft.     Tenderness: There is no abdominal tenderness. There is no guarding or rebound.   Musculoskeletal:     Cervical back: Neck supple.     Right lower leg: No edema.     Left lower leg: No edema.  Skin:    General: Skin is warm and dry.     Coloration: Skin is not jaundiced or pale.  Neurological:     General: No focal deficit present.     Mental Status: She is alert and oriented to person, place, and time. Mental status is at baseline.  Psychiatric:        Mood and Affect: Mood normal.        Behavior: Behavior normal.        Thought Content: Thought content normal.  Judgment: Judgment normal.     Assessment:  Ms. Charlann Wayne is a 51 y.o. female  who presents today for Esophagogastroduodenoscopy and Colonoscopy for Dysphagia, alternating diarrhea and constipation; fhx polyps.  Plan:  Esophagogastroduodenoscopy and Colonoscopy with possible intervention today  Esophagogastroduodenoscopy and Colonoscopy with possible biopsy, control of bleeding, polypectomy, and interventions as necessary has been discussed with the patient/patient representative. Informed consent was obtained from the patient/patient representative after explaining the indication, nature, and risks of the procedure including but not limited to death, bleeding, perforation, missed neoplasm/lesions, cardiorespiratory compromise, and reaction to medications. Opportunity for questions was given and appropriate answers were provided. Patient/patient representative has verbalized understanding is amenable to undergoing the procedure.   [name], DO  [facility]  Portions of the record may have been created with voice recognition software. Occasional wrong-word or 'sound-a-like' substitutions may have occurred due to the inherent limitations of voice recognition software.  Read the chart carefully and recognize, using context, where substitutions may have occurred.

## 2022-07-06 ENCOUNTER — Encounter: Payer: Self-pay | Admitting: Gastroenterology

## 2022-07-09 ENCOUNTER — Encounter: Payer: Self-pay | Admitting: Otolaryngology

## 2022-07-12 LAB — SURGICAL PATHOLOGY

## 2022-08-27 ENCOUNTER — Telehealth: Payer: Self-pay | Admitting: Nurse Practitioner

## 2022-08-27 NOTE — Telephone Encounter (Signed)
I talked with the patient at 9:39 am this morning. This was a followup from her call to Access Nurse. I gave her the recommendations that Rayetta Pigg, NP gave , this was to go to Urgent Care or see her PCP. Patient stated that  all of the skin from her toes was gone and that she was okay now. I ask her about the boils that she had emntioned to the access nurse , she states that they are atr gone, and repeated that she was okay.

## 2022-08-27 NOTE — Telephone Encounter (Signed)
New message    The patient called left a voicemail    Breakdown on her two big toes - asking what can she do for it .

## 2022-09-03 ENCOUNTER — Ambulatory Visit (INDEPENDENT_AMBULATORY_CARE_PROVIDER_SITE_OTHER): Payer: Medicare Other | Admitting: Nurse Practitioner

## 2022-09-03 ENCOUNTER — Encounter: Payer: Self-pay | Admitting: Nurse Practitioner

## 2022-09-03 ENCOUNTER — Ambulatory Visit: Payer: Medicare Other | Admitting: Nutrition

## 2022-09-03 VITALS — BP 110/71 | HR 76 | Ht 65.25 in | Wt 200.4 lb

## 2022-09-03 DIAGNOSIS — I10 Essential (primary) hypertension: Secondary | ICD-10-CM | POA: Diagnosis not present

## 2022-09-03 DIAGNOSIS — E119 Type 2 diabetes mellitus without complications: Secondary | ICD-10-CM

## 2022-09-03 DIAGNOSIS — E782 Mixed hyperlipidemia: Secondary | ICD-10-CM

## 2022-09-03 LAB — POCT GLYCOSYLATED HEMOGLOBIN (HGB A1C): Hemoglobin A1C: 7.5 % — AB (ref 4.0–5.6)

## 2022-09-03 MED ORDER — TRUE METRIX BLOOD GLUCOSE TEST VI STRP
ORAL_STRIP | 11 refills | Status: AC
Start: 2022-09-03 — End: ?

## 2022-09-03 MED ORDER — TRUEPLUS LANCETS 33G MISC
11 refills | Status: AC
Start: 2022-09-03 — End: ?

## 2022-09-03 NOTE — Progress Notes (Signed)
Endocrinology Follow Up Note       09/03/2022, 9:42 AM   Subjective:    Patient ID: Ann [name], female    DOB: March 15, 1971.  Ann [name] is being seen in follow up after being seen in consultation for management of currently uncontrolled symptomatic diabetes requested by  Ann Breeze, FNP.   Past Medical History:  Diagnosis Date   Alcohol abuse    Anemia    Arthritis    KNEE   Bipolar 1 disorder ([place])    Complication of anesthesia    HARD TO WAKE UP WITH CERVIX SURGERY   Coronary artery disease    Depression    Diabetes mellitus, type II ([place])    Drug abuse ([place])    Dysrhythmia    SVT PER CARDIOLOGY NOTE IN 2016   GERD (gastroesophageal reflux disease)    Headache    Hyperlipidemia    Schizophrenia ([place])    Smokers' cough [facility])     Past Surgical History:  Procedure Laterality Date   CERVIX SURGERY     COLONOSCOPY N/A 07/05/2022   Procedure: COLONOSCOPY;  Surgeon: Ann Helling, DO;  Location: [place];  Service: Gastroenterology;  Laterality: N/A;  Dr. Pryor [name] to do excision from nose. Needs headlamp. OK'd per PM   ESOPHAGOGASTRODUODENOSCOPY N/A 07/05/2022   Procedure: ESOPHAGOGASTRODUODENOSCOPY (EGD);  Surgeon: Ann Helling, DO;  Location: [facility] ENDOSCOPY;  Service: Gastroenterology;  Laterality: N/A;   EXCISION NASAL MASS Left 07/05/2022   Procedure: EXCISION NASAL LESION - joint case in ENDO;  Surgeon: Ann Manner, MD;  Location: [facility] ORS;  Service: ENT;  Laterality: Left;   EXCISION ORAL TUMOR Right 12/27/2021   Procedure: EXCISION OF SOFT PALATE MASS;  Surgeon: Ann Manner, MD;  Location: [place];  Service: ENT;  Laterality: Right;  Diabetic   GALLBLADDER SURGERY  06/15/2020   HYSTEROSCOPY WITH D & C N/A 04/28/2018   Procedure: DILATATION AND CURETTAGE /HYSTEROSCOPY;  Surgeon: Ann [name], Ann Loh, MD;  Location: [facility] ORS;  Service: Gynecology;   Laterality: N/A;   POLYPECTOMY  04/28/2018   Procedure: POLYPECTOMY;  Surgeon: Ann [name], Ann Loh, MD;  Location: [facility] ORS;  Service: Gynecology;;   TUBAL LIGATION      Social History   Socioeconomic History   Marital status: Married    Spouse name: Ann [name]   Number of children: Not on file   Years of education: Not on file   Highest education level: Not on file  Occupational History   Not on file  Tobacco Use   Smoking status: Every Day    Packs/day: 3.00    Years: 30.00    Total pack years: 90.00    Types: Cigarettes   Smokeless tobacco: Never   Tobacco comments:    Currently only smokes 2 PPD  Vaping Use   Vaping Use: Never used  Substance and Sexual Activity   Alcohol use: Not Currently   Drug use: Yes    Types: Cocaine, Marijuana    Comment: pt reports none since 2017   Sexual activity: Not on file  Other Topics Concern   Not on file  Social History Narrative   Not on file   Social Determinants  of Health   Financial Resource Strain: Medium Risk (01/22/2022)   Overall Financial Resource Strain (CARDIA)    Difficulty of Paying Living Expenses: Somewhat hard  Food Insecurity: Food Insecurity Present (01/22/2022)   Hunger Vital Sign    Worried About Running Out of Food in the Last Year: Sometimes true    Ran Out of Food in the Last Year: Often true  Transportation Needs: Unmet Transportation Needs (01/22/2022)   PRAPARE - Hydrologist (Medical): Yes    Lack of Transportation (Non-Medical): Yes  Physical Activity: Not on file  Stress: Stress Concern Present (01/22/2022)   [place]    Feeling of Stress : Very much  Social Connections: Not on file    Family History  Problem Relation Age of Onset   Hypertension Mother    Diabetes Mother    Hyperlipidemia Mother    Heart attack Mother    Cancer Father     Outpatient Encounter Medications as of 09/03/2022  Medication  Sig   atenolol (TENORMIN) 25 MG tablet Take 25 mg by mouth daily.   Ferrous Sulfate (IRON PO) Take 1,000 mg by mouth daily.   fluticasone (FLONASE) 50 MCG/ACT nasal spray Place 2 sprays into both nostrils 2 (two) times daily.   gabapentin (NEURONTIN) 300 MG capsule Take 300 mg by mouth as needed.   glipiZIDE (GLUCOTROL XL) 5 MG 24 hr tablet Take 1 tablet (5 mg total) by mouth daily with breakfast.   GNP ASPIRIN LOW DOSE 81 MG EC tablet Take 81 mg by mouth daily.   haloperidol lactate (HALDOL) 5 MG/ML injection Inject 100 mg into the muscle every 30 (thirty) days.    metFORMIN (GLUCOPHAGE) 1000 MG tablet Take 1 tablet (1,000 mg total) by mouth 2 (two) times daily with a meal.   omeprazole (PRILOSEC) 20 MG capsule Take 40 mg by mouth every morning.    rosuvastatin (CRESTOR) 40 MG tablet Take 40 mg by mouth daily.   topiramate (TOPAMAX) 50 MG tablet Take 50 mg by mouth 2 (two) times daily.   VRAYLAR 3 MG capsule Take 3 mg by mouth daily.   [DISCONTINUED] TRUE METRIX BLOOD GLUCOSE TEST test strip SMARTSIG:Via Meter   [DISCONTINUED] TRUEplus Lancets 33G MISC SMARTSIG:1 Topical Daily   clotrimazole (LOTRIMIN) 1 % cream Apply 1 Application topically in the morning, at noon, and at bedtime. (Patient not taking: Reported on 09/03/2022)   Multiple Vitamins-Minerals (CENTRUM SILVER ADULT 50+ PO) Take by mouth daily. (Patient not taking: Reported on 09/03/2022)   mupirocin ointment (BACTROBAN) 2 % SMARTSIG:1 Application Topical 2-3 Times Daily (Patient not taking: Reported on 09/03/2022)   mupirocin ointment (BACTROBAN) 2 % Apply 1 Application topically 3 (three) times daily. Apply topically to left nostril TID x 10 days (Patient not taking: Reported on 09/03/2022)   TRUE METRIX BLOOD GLUCOSE TEST test strip Use as instructed to monitor glucose twice daily   TRUEplus Lancets 33G MISC Use to monitor glucose twice daily   No facility-administered encounter medications on file as of 09/03/2022.     ALLERGIES: No Known Allergies  VACCINATION STATUS:  There is no immunization history on file for this patient.  Diabetes She presents for her follow-up diabetic visit. She has type 2 diabetes mellitus. Onset time: Diagnoses at approx age of 41. Her disease course has been worsening. There are no hypoglycemic associated symptoms. Associated symptoms include fatigue. There are no hypoglycemic complications. Symptoms are improving. Diabetic complications  include heart disease. Risk factors for coronary artery disease include diabetes mellitus, dyslipidemia, family history, obesity, hypertension, sedentary lifestyle and tobacco exposure. Current diabetic treatment includes oral agent (dual therapy). She is compliant with treatment most of the time. Her weight is fluctuating minimally. She is following a generally unhealthy diet. When asked about meal planning, she reported none. She has not had a previous visit with a dietitian. She rarely participates in exercise. Her home blood glucose trend is increasing steadily. Her overall blood glucose range is 180-200 mg/dl. (She presents today with her meter and logs showing inconsistent glucose monitoring pattern with fluctuating fasting readings.  Her POCT A1c today is 7.5% increasing slightly from last visit of 7.2%.  Analysis of her meter shows 7-day average of 189, 14-day average of 186, 30-day average of 192.  She admits she has been going through a lot lately with family issues and mental health issues.) An ACE inhibitor/angiotensin II receptor blocker is not being taken. She does not see a podiatrist.Eye exam is current.  Hyperlipidemia This is a chronic problem. The current episode started more than 1 year ago. The problem is uncontrolled. Recent lipid tests were reviewed and are variable. Exacerbating diseases include diabetes. Factors aggravating her hyperlipidemia include fatty foods, smoking and beta blockers. Current antihyperlipidemic treatment  includes statins. The current treatment provides mild improvement of lipids. Compliance problems include adherence to diet and adherence to exercise.  Risk factors for coronary artery disease include diabetes mellitus, dyslipidemia, family history, obesity, hypertension and a sedentary lifestyle.  Hypertension This is a chronic problem. The current episode started more than 1 year ago. The problem has been resolved since onset. The problem is controlled. There are no associated agents to hypertension. Risk factors for coronary artery disease include smoking/tobacco exposure, diabetes mellitus, dyslipidemia, family history, obesity and sedentary lifestyle. Past treatments include beta blockers. The current treatment provides moderate improvement. Compliance problems include diet and exercise.  Hypertensive end-organ damage includes CAD/MI.     Review of systems  Constitutional: + stable body weight, current Body mass index is 33.09 kg/m., + fatigue-improved, no subjective hyperthermia, no subjective hypothermia Eyes: no blurry vision, no xerophthalmia ENT: no sore throat, no nodules palpated in throat, no dysphagia/odynophagia, no hoarseness Cardiovascular: no chest pain, no shortness of breath, no palpitations, no leg swelling Respiratory: no cough, no shortness of breath Gastrointestinal: no nausea/vomiting/diarrhea Musculoskeletal: no muscle/joint aches Skin: no rashes, no hyperemia Neurological: no tremors, no numbness, no tingling, no dizziness Psychiatric: + depression, no anxiety, mood disorder (schizophrenia)- managed with meds  Objective:     BP 110/71 (BP Location: Left Arm, Patient Position: Sitting, Cuff Size: Large)   Pulse 76   Ht 5' 5.25" (1.657 m)   Wt 200 lb 6.4 oz (90.9 kg)   BMI 33.09 kg/m   Wt Readings from Last 3 Encounters:  09/03/22 200 lb 6.4 oz (90.9 kg)  07/05/22 196 lb (88.9 kg)  05/01/22 194 lb 12.8 oz (88.4 kg)     BP Readings from Last 3 Encounters:   09/03/22 110/71  07/05/22 (!) 103/59  05/01/22 113/75     Physical Exam- Limited  Constitutional:  Body mass index is 33.09 kg/m. , not in acute distress Eyes:  EOMI, no exophthalmos Neck: Supple Cardiovascular: RRR, no murmurs, rubs, or gallops, no edema Respiratory: Adequate breathing efforts, no crackles, rales, rhonchi, or wheezing Musculoskeletal: no gross deformities, strength intact in all four extremities, no gross restriction of joint movements Skin:  no rashes, no hyperemia, multiple  skin lesions to upper extremities in various stages of healing, nicotinic discoloration to fingernails bilaterally Neurological: no tremor with outstretched hands    CMP ( most recent) CMP     Component Value Date/Time   NA 139 04/24/2018 0938   K 4.4 04/24/2018 0938   CL 103 04/24/2018 0938   CO2 26 04/24/2018 0938   GLUCOSE 126 (H) 04/24/2018 0938   BUN 8 01/24/2022 0000   CREATININE 0.8 01/24/2022 0000   CREATININE 0.80 04/24/2018 0938   CALCIUM 9.5 04/24/2018 0938   PROT 8.5 (H) 09/21/2015 1319   ALBUMIN 4.5 09/21/2015 1319   AST 27 09/21/2015 1319   ALT 30 09/21/2015 1319   ALKPHOS 116 09/21/2015 1319   BILITOT 0.5 09/21/2015 1319   GFRNONAA 96 06/29/2021 0000   GFRAA >60 04/24/2018 0938     Diabetic Labs (most recent): Lab Results  Component Value Date   HGBA1C 7.5 (A) 09/03/2022   HGBA1C 7.2 (A) 05/01/2022   HGBA1C 9.0 (A) 01/29/2022   MICROALBUR 6 01/24/2022     Lipid Panel ( most recent) Lipid Panel     Component Value Date/Time   TRIG 210 (A) 01/24/2022 0000   LDLCALC 68 01/24/2022 0000      Lab Results  Component Value Date   TSH 1.88 06/29/2021           Assessment & Plan:   1) Type 2 diabetes mellitus without complication, without long-term current use of insulin ([place])  She presents today with her meter and logs showing inconsistent glucose monitoring pattern with fluctuating fasting readings.  Her POCT A1c today is 7.5% increasing  slightly from last visit of 7.2%.  Analysis of her meter shows 7-day average of 189, 14-day average of 186, 30-day average of 192.  She admits she has been going through a lot lately with family issues and mental health issues.  - Ann [name] has currently uncontrolled symptomatic type 2 DM since 51 years of age.   -Recent labs reviewed.  - I had a long discussion with her about the progressive nature of diabetes and the pathology behind its complications. -her diabetes is complicated by CAD, smoking and she remains at a high risk for more acute and chronic complications which include CAD, CVA, CKD, retinopathy, and neuropathy. These are all discussed in detail with her.  - Nutritional counseling repeated at each appointment due to patients tendency to fall back in to old habits.  - The patient admits there is a room for improvement in their diet and drink choices. -  Suggestion is made for the patient to avoid simple carbohydrates from their diet including Cakes, Sweet Desserts / Pastries, Ice Cream, Soda (diet and regular), Sweet Tea, Candies, Chips, Cookies, Sweet Pastries, Store Bought Juices, Alcohol in Excess of 1-2 drinks a day, Artificial Sweeteners, Coffee Creamer, and "Sugar-free" Products. This will help patient to have stable blood glucose profile and potentially avoid unintended weight gain.   - I encouraged the patient to switch to unprocessed or minimally processed complex starch and increased protein intake (animal or plant source), fruits, and vegetables.   - Patient is advised to stick to a routine mealtimes to eat 3 meals a day and avoid unnecessary snacks (to snack only to correct hypoglycemia).  - she will be scheduled with Jearld Fenton, RDN, CDE for diabetes education.  - I have approached her with the following individualized plan to manage her diabetes and patient agrees:   -She is advised to continue Metformin 1000  mg po twice daily with meals and Glipizide 5 mg  XL daily with breakfast.   -she is encouraged to continue monitoring glucose twice daily, before breakfast and before bed, and to call the clinic if she has readings less than 70 or above 200 for 3 tests in a row.   - Adjustment parameters are given to her for hypo and hyperglycemia in writing.  - she is not an ideal candidate for incretin therapy due to hypertriglyceridemia and heavy smoking history increasing her risk of pancreatitis.  - Specific targets for  A1c; LDL, HDL, and Triglycerides were discussed with the patient.  2) Blood Pressure /Hypertension:  her blood pressure is controlled to target.   she is advised to continue her current medications including Atenolol 25 mg p.o. daily with breakfast.  3) Lipids/Hyperlipidemia:    Review of her recent lipid panel from 01/24/22 showed controlled LDL at 68 and elevated triglycerides of 210 (improving) .  she is advised to continue Crestor 40 mg daily at bedtime.  Side effects and precautions discussed with her.  4)  Weight/Diet:  her Body mass index is 33.09 kg/m.  -  clearly complicating her diabetes care.   she is a candidate for weight loss. I discussed with her the fact that loss of 5 - 10% of her  current body weight will have the most impact on her diabetes management.  Exercise, and detailed carbohydrates information provided  -  detailed on discharge instructions.  5) Chronic Care/Health Maintenance: -she is not on ACEI/ARB and is on Statin medications and is encouraged to initiate and continue to follow up with Ophthalmology, Dentist, Podiatrist at least yearly or according to recommendations, and advised to [place]. I have recommended yearly flu vaccine and pneumonia vaccine at least every 5 years; moderate intensity exercise for up to 150 minutes weekly; and sleep for at least 7 hours a day.  - she is advised to maintain close follow up with Ann Breeze, FNP for primary care needs, as well as her other providers for  optimal and coordinated care.  I have requested the patient reach out to her PCP to have recent labs faxed to [place] for our records.      I spent 36 minutes in the care of the patient today including review of labs from [place], Lipids, Thyroid Function, Hematology (current and previous including abstractions from other facilities); face-to-face time discussing  her blood glucose readings/logs, discussing hypoglycemia and hyperglycemia episodes and symptoms, medications doses, her options of short and long term treatment based on the latest standards of care / guidelines;  discussion about incorporating lifestyle medicine;  and documenting the encounter. Risk reduction counseling performed per USPSTF guidelines to reduce obesity and cardiovascular risk factors.     Please refer to Patient Instructions for Blood Glucose Monitoring and Insulin/Medications Dosing Guide"  in media tab for additional information. Please  also refer to " Patient Self Inventory" in the Media  tab for reviewed elements of pertinent patient history.  Ann [name] participated in the discussions, expressed understanding, and voiced agreement with the above plans.  All questions were answered to her satisfaction. she is encouraged to contact clinic should she have any questions or concerns prior to her return visit.     Follow up plan: - Return in about 4 months (around 01/04/2023) for Diabetes F/U with A1c in office, No previsit labs, Bring meter and logs.   Rayetta Pigg, FNP-BC [facility] [address] [place],  [place] [address] Phone: [phone] Fax: [phone]  09/03/2022, 9:42 AM

## 2022-12-12 ENCOUNTER — Telehealth: Payer: Self-pay | Admitting: *Deleted

## 2022-12-12 NOTE — Telephone Encounter (Signed)
Patient left a voicemail. She states that she now has a different insurance and that she is sorry to say that she will not be able to come back to see [place]. We are not in her network with this new insurance.  She has appointment on 01/07/2023 at 9:30 and this needs to be cancelled. She also stated that she could not see Jearld Fenton anymore either.  She says that it has been a pleasure and she appreciates everyone.

## 2022-12-12 NOTE — Telephone Encounter (Signed)
Noted, thanks for the update!  I wish her well.  I will cc [place] so she can cancel those appts.

## 2023-01-07 ENCOUNTER — Ambulatory Visit: Payer: Medicare Other | Admitting: Nurse Practitioner

## 2023-01-24 ENCOUNTER — Encounter: Payer: Self-pay | Admitting: Gastroenterology

## 2023-01-24 ENCOUNTER — Encounter
Admission: RE | Disposition: A | Discharge status: Home / Self Care | Payer: Self-pay | Source: Home / Self Care | Attending: Gastroenterology

## 2023-01-24 ENCOUNTER — Ambulatory Visit: Payer: Medicare HMO | Admitting: Anesthesiology

## 2023-01-24 ENCOUNTER — Ambulatory Visit
Admission: RE | Admit: 2023-01-24 | Discharge: 2023-01-24 | Disposition: A | Discharge status: Home / Self Care | Payer: Medicare HMO | Attending: Gastroenterology | Admitting: Gastroenterology

## 2023-01-24 DIAGNOSIS — F319 Bipolar disorder, unspecified: Secondary | ICD-10-CM | POA: Insufficient documentation

## 2023-01-24 DIAGNOSIS — Z83719 Family history of colon polyps, unspecified: Secondary | ICD-10-CM | POA: Insufficient documentation

## 2023-01-24 DIAGNOSIS — R519 Headache, unspecified: Secondary | ICD-10-CM | POA: Diagnosis not present

## 2023-01-24 DIAGNOSIS — I471 Supraventricular tachycardia, unspecified: Secondary | ICD-10-CM | POA: Insufficient documentation

## 2023-01-24 DIAGNOSIS — Z09 Encounter for follow-up examination after completed treatment for conditions other than malignant neoplasm: Secondary | ICD-10-CM | POA: Diagnosis present

## 2023-01-24 DIAGNOSIS — F17210 Nicotine dependence, cigarettes, uncomplicated: Secondary | ICD-10-CM | POA: Insufficient documentation

## 2023-01-24 DIAGNOSIS — K635 Polyp of colon: Secondary | ICD-10-CM | POA: Insufficient documentation

## 2023-01-24 DIAGNOSIS — K621 Rectal polyp: Secondary | ICD-10-CM | POA: Diagnosis not present

## 2023-01-24 DIAGNOSIS — K219 Gastro-esophageal reflux disease without esophagitis: Secondary | ICD-10-CM | POA: Insufficient documentation

## 2023-01-24 DIAGNOSIS — K640 First degree hemorrhoids: Secondary | ICD-10-CM | POA: Insufficient documentation

## 2023-01-24 DIAGNOSIS — I2510 Atherosclerotic heart disease of native coronary artery without angina pectoris: Secondary | ICD-10-CM | POA: Diagnosis not present

## 2023-01-24 HISTORY — PX: COLONOSCOPY WITH PROPOFOL: SHX5780

## 2023-01-24 LAB — GLUCOSE, CAPILLARY: Glucose-Capillary: 171 mg/dL — ABNORMAL HIGH (ref 70–99)

## 2023-01-24 SURGERY — COLONOSCOPY WITH PROPOFOL
Anesthesia: General

## 2023-01-24 MED ORDER — SODIUM CHLORIDE 0.9 % IV SOLN
INTRAVENOUS | Status: DC
Start: 2023-01-24 — End: 2023-01-24

## 2023-01-24 MED ORDER — PROPOFOL 500 MG/50ML IV EMUL
INTRAVENOUS | Status: DC | PRN
  Administered 2023-01-24: 140 ug/kg/min via INTRAVENOUS

## 2023-01-24 MED ORDER — EPHEDRINE SULFATE (PRESSORS) 50 MG/ML IJ SOLN
INTRAMUSCULAR | Status: DC | PRN
  Administered 2023-01-24: 5 mg via INTRAVENOUS

## 2023-01-24 MED ORDER — PROPOFOL 10 MG/ML IV BOLUS
INTRAVENOUS | Status: DC | PRN
  Administered 2023-01-24: 70 mg via INTRAVENOUS

## 2023-01-24 NOTE — H&P (Signed)
Pre-Procedure H&P   Patient ID: Ann [name] is a 52 y.o. female.  Gastroenterology Provider: [name], DO  Referring Provider: Dawson Bills, NP PCP: Ludwig Clarks, FNP  Date: 01/24/2023  HPI [name] is a 52 y.o. female who presents today for Colonoscopy for surveillance- phx colon polyps .  Patient underwent colonoscopy in August 2023.  This demonstrated normal TI.  This also demonstrated a large ascending colon polyp requiring LV lift and hot snare.  This is overlying a lipoma and pathology demonstrated sessile serrated polyp.  1 hemostatic clip was placed.  A tattoo was also placed.  She also had 3 additional tubular adenomatous polyps  BM moving regularly without melena/hematochezia/diarrhea/constipation  Mother- colon polyps   Past Medical History:  Diagnosis Date   Alcohol abuse    Anemia    Arthritis    KNEE   Bipolar 1 disorder ([place])    Complication of anesthesia    HARD TO WAKE UP WITH CERVIX SURGERY   Coronary artery disease    Depression    Diabetes mellitus, type II ([place])    Drug abuse ([place])    Dysrhythmia    SVT PER CARDIOLOGY NOTE IN 2016   GERD (gastroesophageal reflux disease)    Headache    Hyperlipidemia    Schizophrenia ([place])    Smokers' cough [facility])     Past Surgical History:  Procedure Laterality Date   CERVIX SURGERY     COLONOSCOPY N/A 07/05/2022   Procedure: COLONOSCOPY;  Surgeon: [name], DO;  Location: [facility] ENDOSCOPY;  Service: Gastroenterology;  Laterality: N/A;  Dr. Pryor Ochoa to do excision from nose. Needs headlamp. OK'd per PM   ESOPHAGOGASTRODUODENOSCOPY N/A 07/05/2022   Procedure: ESOPHAGOGASTRODUODENOSCOPY (EGD);  Surgeon: [name], DO;  Location: [facility] ENDOSCOPY;  Service: Gastroenterology;  Laterality: N/A;   EXCISION NASAL MASS Left 07/05/2022   Procedure: EXCISION NASAL LESION - joint case in ENDO;  Surgeon: Carloyn Manner, MD;  Location: [facility] ORS;  Service: ENT;  Laterality:  Left;   EXCISION ORAL TUMOR Right 12/27/2021   Procedure: EXCISION OF SOFT PALATE MASS;  Surgeon: Carloyn Manner, MD;  Location: [place];  Service: ENT;  Laterality: Right;  Diabetic   GALLBLADDER SURGERY  06/15/2020   HYSTEROSCOPY WITH D & C N/A 04/28/2018   Procedure: DILATATION AND CURETTAGE /HYSTEROSCOPY;  Surgeon: Ward, Honor Loh, MD;  Location: [facility] ORS;  Service: Gynecology;  Laterality: N/A;   POLYPECTOMY  04/28/2018   Procedure: POLYPECTOMY;  Surgeon: Ward, Honor Loh, MD;  Location: [facility] ORS;  Service: Gynecology;;   TUBAL LIGATION      Family History Mother- colon polyps No h/o GI disease or malignancy  Review of Systems  Constitutional:  Negative for activity change, appetite change, chills, diaphoresis, fatigue, fever and unexpected weight change.  HENT:  Negative for trouble swallowing and voice change.   Respiratory:  Negative for shortness of breath and wheezing.   Cardiovascular:  Negative for chest pain, palpitations and leg swelling.  Gastrointestinal:  Negative for abdominal distention, abdominal pain, anal bleeding, blood in stool, constipation, diarrhea, nausea, rectal pain and vomiting.  Musculoskeletal:  Negative for arthralgias and myalgias.  Skin:  Negative for color change and pallor.  Neurological:  Negative for dizziness, syncope and weakness.  Psychiatric/Behavioral:  Negative for confusion.   All other systems reviewed and are negative.    Medications No current facility-administered medications on file prior to encounter.   Current Outpatient Medications on File Prior to Encounter  Medication Sig Dispense Refill   atenolol (TENORMIN) 25 MG tablet Take 25 mg by mouth daily.     fluticasone (FLONASE) 50 MCG/ACT nasal spray Place 2 sprays into both nostrils 2 (two) times daily.     gabapentin (NEURONTIN) 300 MG capsule Take 300 mg by mouth as needed.     glipiZIDE (GLUCOTROL XL) 5 MG 24 hr tablet Take 1 tablet (5 mg total) by mouth daily  with breakfast. 90 tablet 3   GNP ASPIRIN LOW DOSE 81 MG EC tablet Take 81 mg by mouth daily.     metFORMIN (GLUCOPHAGE) 1000 MG tablet Take 1 tablet (1,000 mg total) by mouth 2 (two) times daily with a meal. 180 tablet 3   omeprazole (PRILOSEC) 20 MG capsule Take 40 mg by mouth every morning.      rosuvastatin (CRESTOR) 40 MG tablet Take 40 mg by mouth daily.     topiramate (TOPAMAX) 50 MG tablet Take 50 mg by mouth 2 (two) times daily.     clotrimazole (LOTRIMIN) 1 % cream Apply 1 Application topically in the morning, at noon, and at bedtime. (Patient not taking: Reported on 09/03/2022)     haloperidol lactate (HALDOL) 5 MG/ML injection Inject 100 mg into the muscle every 30 (thirty) days.      Multiple Vitamins-Minerals (CENTRUM SILVER ADULT 50+ PO) Take by mouth daily. (Patient not taking: Reported on 09/03/2022)     mupirocin ointment (BACTROBAN) 2 % SMARTSIG:1 Application Topical 2-3 Times Daily (Patient not taking: Reported on 09/03/2022)     mupirocin ointment (BACTROBAN) 2 % Apply 1 Application topically 3 (three) times daily. Apply topically to left nostril TID x 10 days (Patient not taking: Reported on 09/03/2022) 22 g 0   VRAYLAR 3 MG capsule Take 3 mg by mouth daily.      Pertinent medications related to GI and procedure were reviewed by me with the patient prior to the procedure   Current Facility-Administered Medications:    0.9 %  sodium chloride infusion, , Intravenous, Continuous, [name], DO  sodium chloride         No Known Allergies Allergies were reviewed by me prior to the procedure  Objective   Body mass index is 32.55 kg/m. Vitals:   01/24/23 0716  BP: 110/61  Pulse: 62  Resp: 16  Temp: (!) 97.2 F (36.2 C)  TempSrc: Temporal  SpO2: 100%  Weight: 88.7 kg  Height: '5\' 5"'$  (1.651 m)     Physical Exam Vitals and nursing note reviewed.  Constitutional:      General: She is not in acute distress.    Appearance: Normal appearance. She is  obese. She is not ill-appearing, toxic-appearing or diaphoretic.  HENT:     Head: Normocephalic and atraumatic.     Nose: Nose normal.     Mouth/Throat:     Mouth: Mucous membranes are moist.     Pharynx: Oropharynx is clear.  Eyes:     General: No scleral icterus.    Extraocular Movements: Extraocular movements intact.  Cardiovascular:     Rate and Rhythm: Normal rate and regular rhythm.     Heart sounds: Normal heart sounds. No murmur heard.    No friction rub. No gallop.  Pulmonary:     Effort: Pulmonary effort is normal. No respiratory distress.     Breath sounds: Normal breath sounds. No wheezing, rhonchi or rales.  Abdominal:     General: Bowel sounds are normal. There is no distension.  Palpations: Abdomen is soft.     Tenderness: There is no abdominal tenderness. There is no guarding or rebound.  Musculoskeletal:     Cervical back: Neck supple.     Right lower leg: No edema.     Left lower leg: No edema.  Skin:    General: Skin is warm and dry.     Coloration: Skin is not jaundiced or pale.  Neurological:     General: No focal deficit present.     Mental Status: She is alert and oriented to person, place, and time. Mental status is at baseline.  Psychiatric:        Mood and Affect: Mood normal.        Behavior: Behavior normal.        Thought Content: Thought content normal.        Judgment: Judgment normal.      Assessment:  [name] is a 52 y.o. female  who presents today for Colonoscopy for surveillance- phx colon polyps .  Plan:  Colonoscopy with possible intervention today  Colonoscopy with possible biopsy, control of bleeding, polypectomy, and interventions as necessary has been discussed with the patient/patient representative. Informed consent was obtained from the patient/patient representative after explaining the indication, nature, and risks of the procedure including but not limited to death, bleeding, perforation, missed  neoplasm/lesions, cardiorespiratory compromise, and reaction to medications. Opportunity for questions was given and appropriate answers were provided. Patient/patient representative has verbalized understanding is amenable to undergoing the procedure.   [name], DO  [facility] Gastroenterology  Portions of the record may have been created with voice recognition software. Occasional wrong-word or 'sound-a-like' substitutions may have occurred due to the inherent limitations of voice recognition software.  Read the chart carefully and recognize, using context, where substitutions may have occurred.

## 2023-01-24 NOTE — Anesthesia Preprocedure Evaluation (Addendum)
Anesthesia Evaluation  Patient identified by MRN, date of birth, ID band Patient awake    Reviewed: Allergy & Precautions, NPO status , Patient's Chart, lab work & pertinent test results  History of Anesthesia Complications (+) PROLONGED EMERGENCE and history of anesthetic complications  Airway Mallampati: IV   Neck ROM: Full    Dental  (+) Missing, Chipped, Loose   Pulmonary Current Smoker (2 ppd) and Patient abstained from smoking.   Pulmonary exam normal breath sounds clear to auscultation       Cardiovascular + CAD (nonobstructive)  Normal cardiovascular exam+ dysrhythmias Supra Ventricular Tachycardia  Rhythm:Regular Rate:Normal     Neuro/Psych  Headaches PSYCHIATRIC DISORDERS  Depression Bipolar Disorder Schizophrenia  Hx cocaine and marijuana use (no cocaine in several years; occasional MJ use)    GI/Hepatic ,GERD  ,,  Endo/Other  diabetes, Type 2  Obesity   Renal/GU      Musculoskeletal  (+) Arthritis ,    Abdominal   Peds  Hematology  (+) Blood dyscrasia, anemia   Anesthesia Other Findings Cardiology note 03/19/22:  1. Chest discomfort - CTA: Showed non-obstructive CAD/calcifications (25% LAD as well as D1, 40% circumflex, see below) - No anginal symptoms at this time - Continue Atenolol 25 mg daily - Continue ASA 81 mg daily - Echo - Showed normal Biventricular function and grade 1 diastolic dysfunction  2. Pure hypercholesterolemia - Continue Crestor 40 mg at bedtime - Goal LDL 70 or less as well as normal range TG/HDL - Will get records from PCP (panel done last month)  3. Obesity (BMI 30.0-34.9) - Reduce caloric intake, increase activity - Follow DASH diet   4. Abnormal resting ECG findings - As above - Unchanged  5. Leg pain, bilateral - Resolved - Normal ABI at rest  6. Tobacco abuse counseling - Still smoking 2 PPD, discussed in detail again today - Will refer back to PCP for  management  Stable on medical therapy, continue with aggressive risk factor modification which was discussed again today, smoking cessation is a must. She voices understanding and agreement.  Return in 12 months or sooner if needed    Reproductive/Obstetrics                             Anesthesia Physical Anesthesia Plan  ASA: 3  Anesthesia Plan: General   Post-op Pain Management:    Induction: Intravenous  PONV Risk Score and Plan: 2 and Propofol infusion, TIVA and Treatment may vary due to age or medical condition  Airway Management Planned: Natural Airway  Additional Equipment:   Intra-op Plan:   Post-operative Plan:   Informed Consent: I have reviewed the patients History and Physical, chart, labs and discussed the procedure including the risks, benefits and alternatives for the proposed anesthesia with the patient or authorized representative who has indicated his/her understanding and acceptance.       Plan Discussed with: CRNA  Anesthesia Plan Comments: (LMA/GETA backup discussed.  Patient consented for risks of anesthesia including but not limited to:  - adverse reactions to medications - damage to eyes, teeth, lips or other oral mucosa - nerve damage due to positioning  - sore throat or hoarseness - damage to heart, brain, nerves, lungs, other parts of body or loss of life  Informed patient about role of CRNA in peri- and intra-operative care.  Patient voiced understanding.)        Anesthesia Quick Evaluation

## 2023-01-24 NOTE — Interval H&P Note (Signed)
History and Physical Interval Note: Preprocedure H&P from 01/24/23  was reviewed and there was no interval change after seeing and examining the patient.  Written consent was obtained from the patient after discussion of risks, benefits, and alternatives. Patient has consented to proceed with Colonoscopy with possible intervention   01/24/2023 7:32 AM  Ann [name]  has presented today for surgery, with the diagnosis of Z86.010 - Hx of adenomatous colonic polyps.  The various methods of treatment have been discussed with the patient and family. After consideration of risks, benefits and other options for treatment, the patient has consented to  Procedure(s): COLONOSCOPY WITH PROPOFOL (N/A) as a surgical intervention.  The patient's history has been reviewed, patient examined, no change in status, stable for surgery.  I have reviewed the patient's chart and labs.  Questions were answered to the patient's satisfaction.     [name]

## 2023-01-24 NOTE — Anesthesia Postprocedure Evaluation (Signed)
Anesthesia Post Note  Patient: Ann [name]  Procedure(s) Performed: COLONOSCOPY WITH PROPOFOL  Patient location during evaluation: PACU Anesthesia Type: General Level of consciousness: awake and alert, oriented and patient cooperative Pain management: pain level controlled Vital Signs Assessment: post-procedure vital signs reviewed and stable Respiratory status: spontaneous breathing, nonlabored ventilation and respiratory function stable Cardiovascular status: blood pressure returned to baseline and stable Postop Assessment: adequate PO intake Anesthetic complications: no   No notable events documented.   Last Vitals:  Vitals:   01/24/23 0816 01/24/23 0821  BP: 95/70 106/70  Pulse: 75 67  Resp: 16 18  Temp:    SpO2: 96% 100%    Last Pain:  Vitals:   01/24/23 0821  TempSrc:   PainSc: 0-No pain                 Darrin Nipper

## 2023-01-24 NOTE — Transfer of Care (Signed)
Immediate Anesthesia Transfer of Care Note  Patient: Ann [name]  Procedure(s) Performed: COLONOSCOPY WITH PROPOFOL  Patient Location: PACU  Anesthesia Type:General  Level of Consciousness: awake, alert , and oriented  Airway & Oxygen Therapy: Patient Spontanous Breathing  Post-op Assessment: Report given to RN and Post -op Vital signs reviewed and stable  Post vital signs: Reviewed and stable  Last Vitals:  Vitals Value Taken Time  BP 110/69 01/24/23 0807  Temp 35.8 C 01/24/23 0806  Pulse 74 01/24/23 0806  Resp 14 01/24/23 0806  SpO2 93 % 01/24/23 0806  Vitals shown include unvalidated device data.  Last Pain:  Vitals:   01/24/23 0806  TempSrc: Temporal  PainSc: Asleep         Complications: No notable events documented.

## 2023-01-24 NOTE — Op Note (Addendum)
[facility] Gastroenterology Patient Name: Ann [name] Procedure Date: 01/24/2023 7:13 AM MRN: [id number] Account #: [id number] Date of Birth: [birth year] Admit Type: Outpatient Age: 52 Room: [facility] ENDO ROOM 4 Gender: Female Note Status: Finalized Instrument Name: Colonoscope [id number] Procedure:             Colonoscopy Indications:           High risk colon cancer surveillance: Personal history                         of colonic polyps Providers:             Rueben Bash, DO Referring MD:          Westley Chandler FNP Medicines:             Monitored Anesthesia Care Complications:         No immediate complications. Estimated blood loss:                         Minimal. Procedure:             Pre-Anesthesia Assessment:                        - Prior to the procedure, a History and Physical was                         performed, and patient medications and allergies were                         reviewed. The patient is competent. The risks and                         benefits of the procedure and the sedation options and                         risks were discussed with the patient. All questions                         were answered and informed consent was obtained.                         Patient identification and proposed procedure were                         verified by the physician, the nurse, the anesthetist                         and the technician in the endoscopy suite. Mental                         Status Examination: alert and oriented. Airway                         Examination: normal oropharyngeal airway and neck                         mobility. Respiratory Examination: clear to  auscultation. CV Examination: RRR, no murmurs, no S3                         or S4. Prophylactic Antibiotics: The patient does not                         require prophylactic antibiotics. Prior                         Anticoagulants:  The patient has taken no anticoagulant                         or antiplatelet agents. ASA Grade Assessment: III - A                         patient with severe systemic disease. After reviewing                         the risks and benefits, the patient was deemed in                         satisfactory condition to undergo the procedure. The                         anesthesia plan was to use monitored anesthesia care                         (MAC). Immediately prior to administration of                         medications, the patient was re-assessed for adequacy                         to receive sedatives. The heart rate, respiratory                         rate, oxygen saturations, blood pressure, adequacy of                         pulmonary ventilation, and response to care were                         monitored throughout the procedure. The physical                         status of the patient was re-assessed after the                         procedure.                        After obtaining informed consent, the colonoscope was                         passed under direct vision. Throughout the procedure,                         the patient's blood pressure, pulse, and oxygen  saturations were monitored continuously. The                         Colonoscope was introduced through the anus and                         advanced to the the cecum, identified by appendiceal                         orifice and ileocecal valve. The colonoscopy was                         performed without difficulty. The patient tolerated                         the procedure well. The quality of the bowel                         preparation was evaluated using the BBPS [facility] Bowel                         Preparation Scale) with scores of: Right Colon = 3                         (entire mucosa seen well with no residual staining,                         small fragments of stool or  opaque liquid), Transverse                         Colon = 3 (entire mucosa seen well with no residual                         staining, small fragments of stool or opaque liquid)                         and Left Colon = 2 (minor amount of residual staining,                         small fragments of stool and/or opaque liquid, but                         mucosa seen well). The total BBPS score equals 8. The                         quality of the bowel preparation was excellent. The                         ileocecal valve, appendiceal orifice, and rectum were                         photographed. Findings:      The perianal and digital rectal examinations were normal. Pertinent       negatives include normal sphincter tone.      Two sessile polyps were found in the sigmoid colon. The polyps were 1 to       2 mm in size.  These polyps were removed with a jumbo cold forceps.       Resection and retrieval were complete. Estimated blood loss was minimal.      Non-bleeding internal hemorrhoids were found during retroflexion. The       hemorrhoids were Grade I (internal hemorrhoids that do not prolapse).       Estimated blood loss: none.      Multiple sessile, hyperplastic appearing polyps were found in the       rectum. The polyps were less than 1 mm in size. Polypectomy not       performed due to hyperplastic, benign nature. Estimated blood loss: none.      A tattoo was seen in the distal ascending colon. The tattoo site       appeared normal. No signs of residual polyp. Estimated blood loss: none.      The exam was otherwise without abnormality on direct and retroflexion       views.      There was moderate spasm in the recto-sigmoid colon and in the sigmoid       colon. Estimated blood loss: none.      The exam was otherwise without abnormality on direct and retroflexion       views. Impression:            - Two 1 to 2 mm polyps in the sigmoid colon, removed                         with a  jumbo cold forceps. Resected and retrieved.                        - Non-bleeding internal hemorrhoids.                        - Multiple less than 1 mm polyps in the rectum.                        - A tattoo was seen in the distal ascending colon. The                         tattoo site appeared normal.                        - The examination was otherwise normal on direct and                         retroflexion views.                        - Moderate colonic spasm consistent with irritable                         bowel syndrome.                        - The examination was otherwise normal on direct and                         retroflexion views. Recommendation:        - Patient has a contact number available for  emergencies. The signs and symptoms of potential                         delayed complications were discussed with the patient.                         Return to normal activities tomorrow. Written                         discharge instructions were provided to the patient.                        - Discharge patient to home.                        - Resume previous diet.                        - Continue present medications.                        - Await pathology results.                        - Repeat colonoscopy for surveillance based on                         pathology results.                        - Return to referring physician as previously                         scheduled.                        - The findings and recommendations were discussed with                         the patient. Procedure Code(s):     --- Professional ---                        [phone], Colonoscopy, flexible; with biopsy, single or                         multiple Diagnosis Code(s):     --- Professional ---                        Z86.010, Personal history of colonic polyps                        D12.5, Benign neoplasm of sigmoid colon                         D12.8, Benign neoplasm of rectum                        K64.0, First degree hemorrhoids CPT copyright 2022 American [employer] Association. All rights reserved. The codes documented in this report are preliminary and upon coder review may  be revised to meet current compliance requirements. Attending Participation:  I personally performed the entire procedure. Volney American, DO [name] DO, DO 01/24/2023 8:10:54 AM This report has been signed electronically. Number of Addenda: 0 Note Initiated On: 01/24/2023 7:13 AM Scope Withdrawal Time: 0 hours 15 minutes 45 seconds  Total Procedure Duration: 0 hours 21 minutes 12 seconds  Estimated Blood Loss:  Estimated blood loss was minimal.      [facility]

## 2023-01-25 ENCOUNTER — Encounter: Payer: Self-pay | Admitting: Gastroenterology

## 2023-01-25 LAB — SURGICAL PATHOLOGY

## 2023-02-19 IMAGING — US US ABDOMEN LIMITED
1 series · 14 of 25 positions shown · non-contrast
Comparison: Limited US Abdomen report, 05/17/2020.

CLINICAL DATA: Elevated liver enzymes.

EXAM:
ULTRASOUND ABDOMEN LIMITED RIGHT UPPER QUADRANT

[Series 1: us abdomen limited ruq (liver/gb) · 14 of 46 slices shown]
[im 1/46]
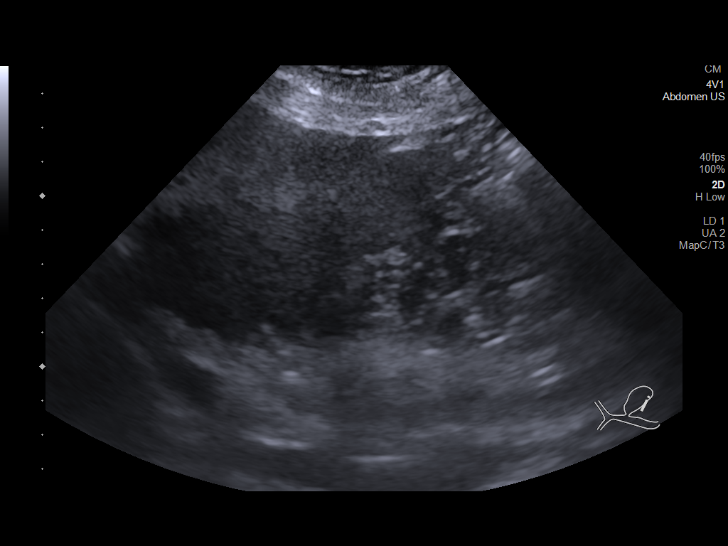
[im 4/46]
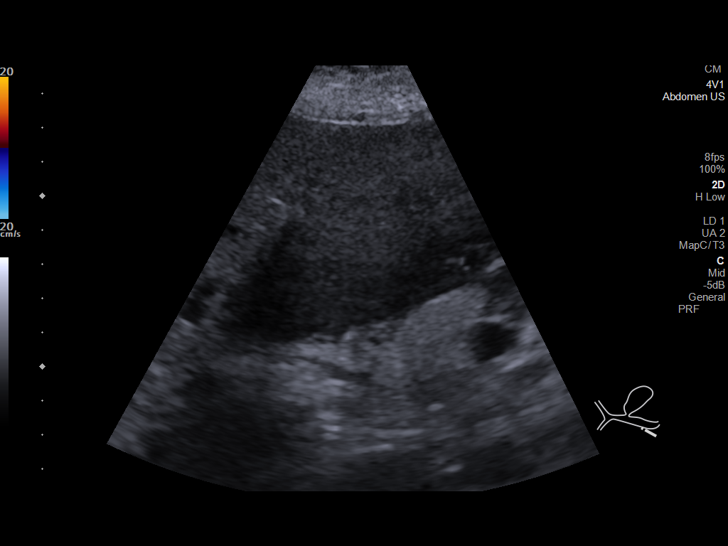
[im 8/46]
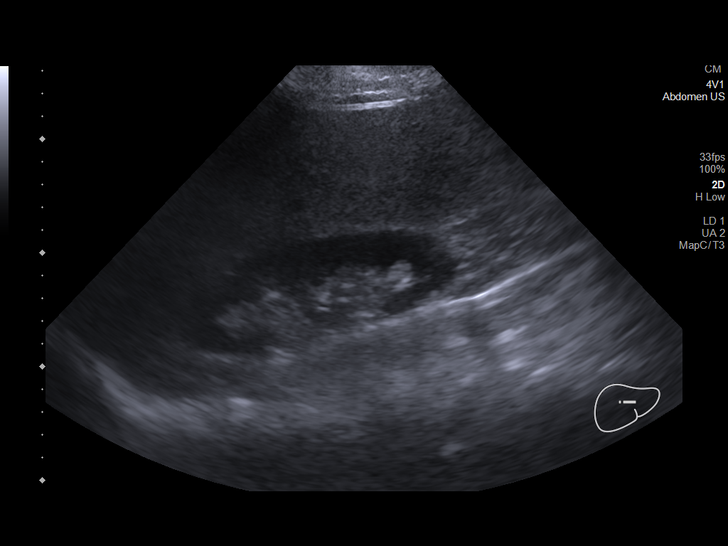
[im 12/46]
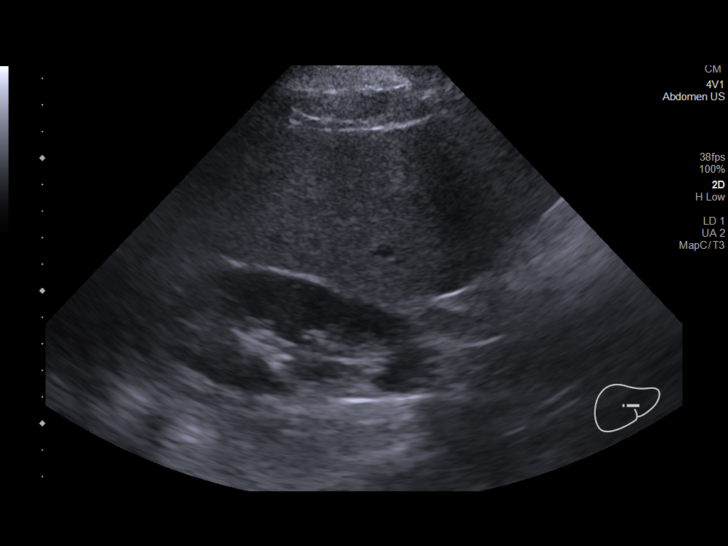
[im 16/46]
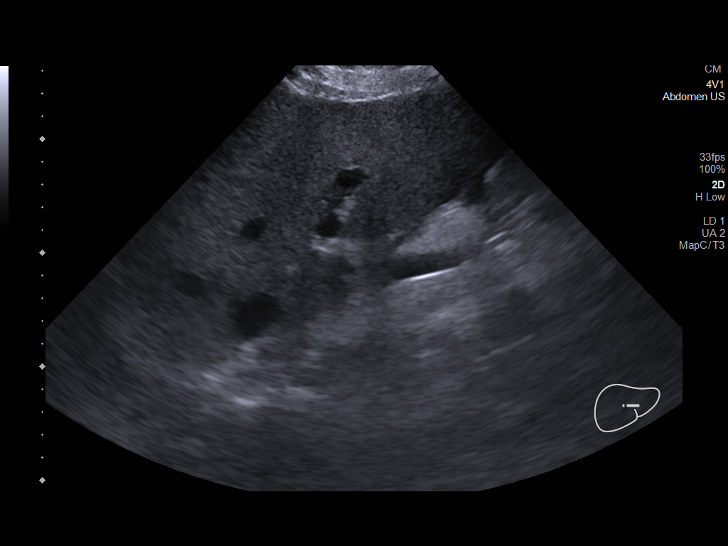
[im 17/46]
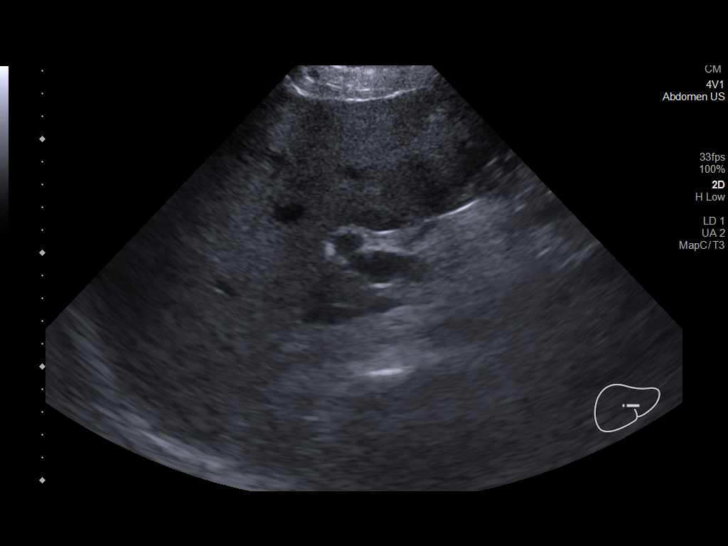
[im 21/46]
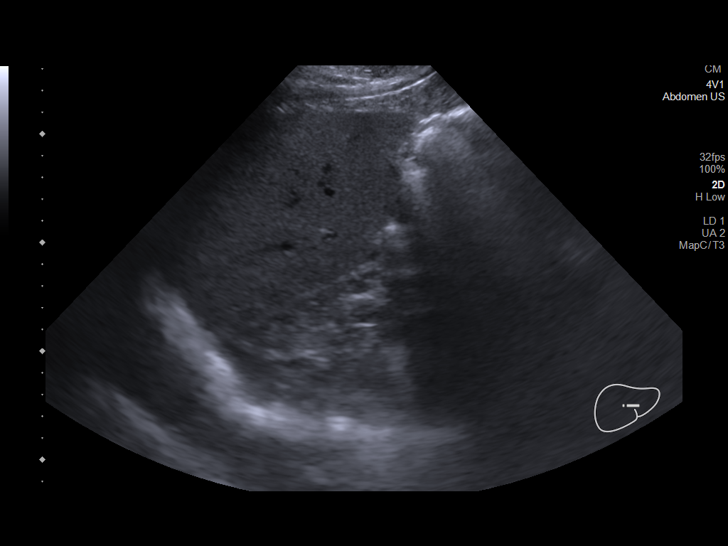
[im 25/46]
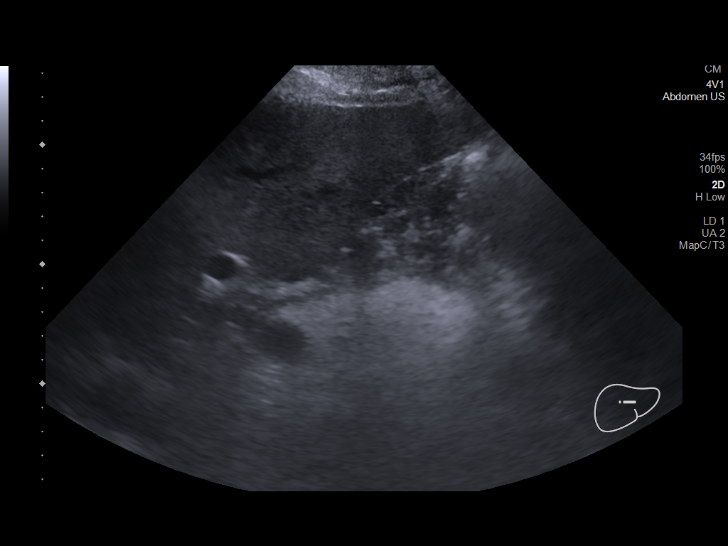
[im 29/46]
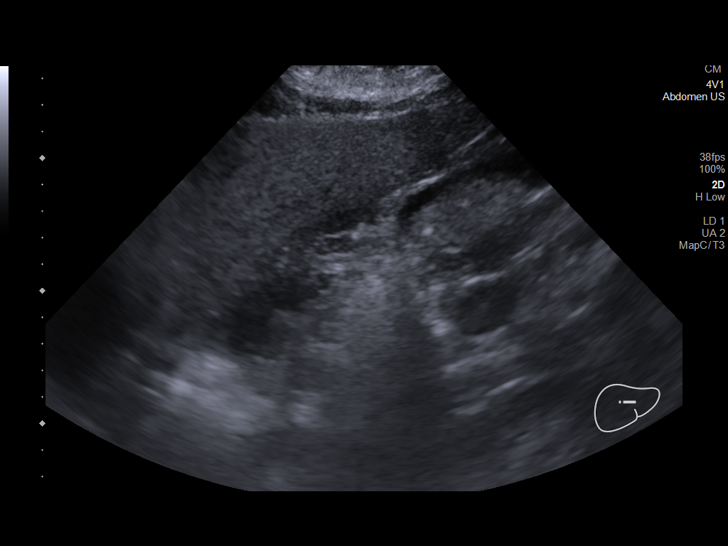
[im 31/46]
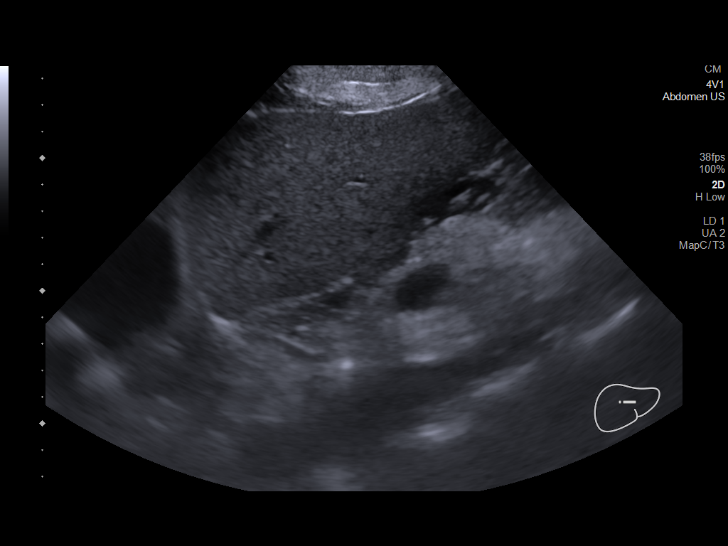
[im 34/46]
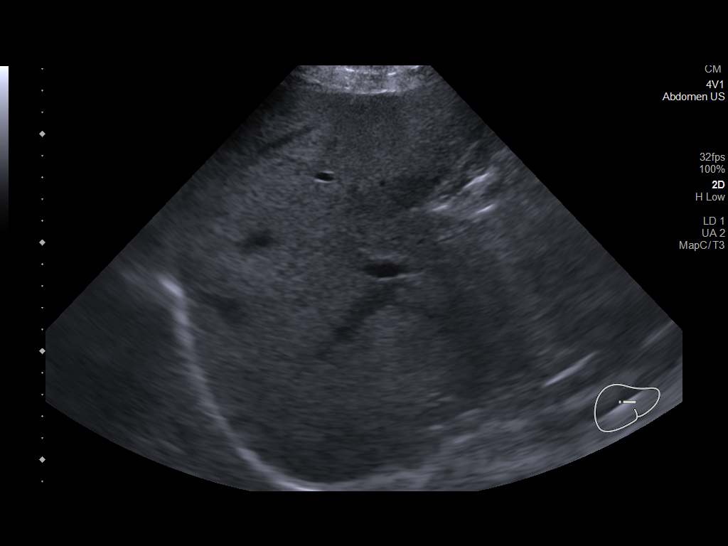
[im 38/46]
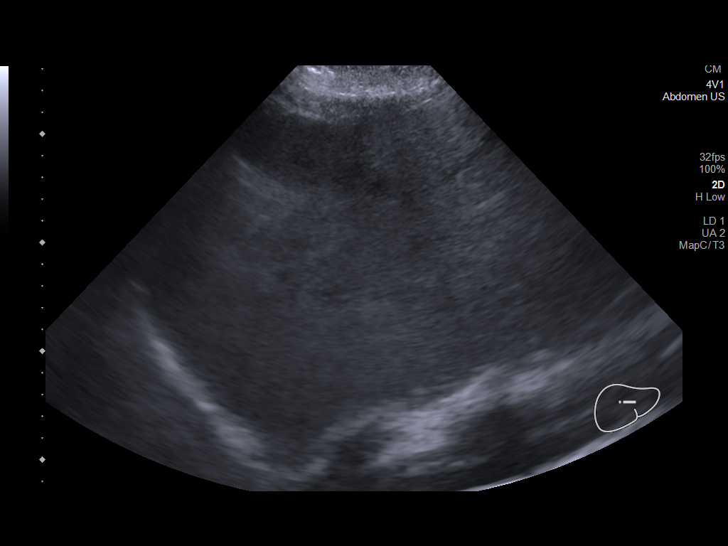
[im 42/46]
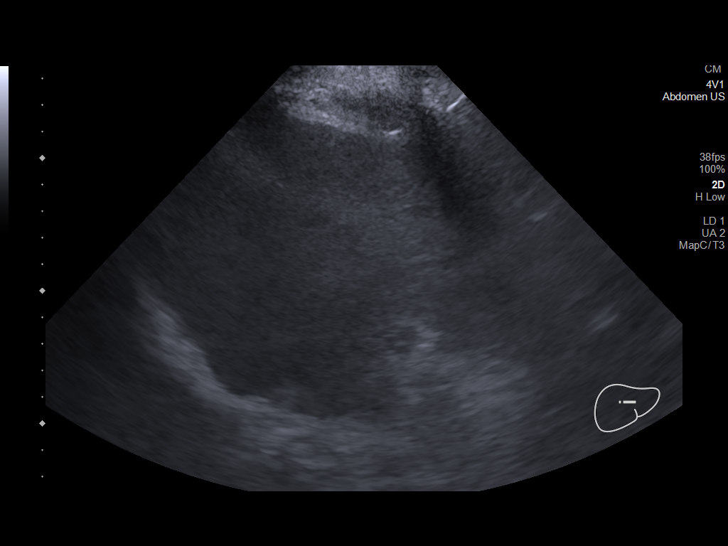
[im 46/46]
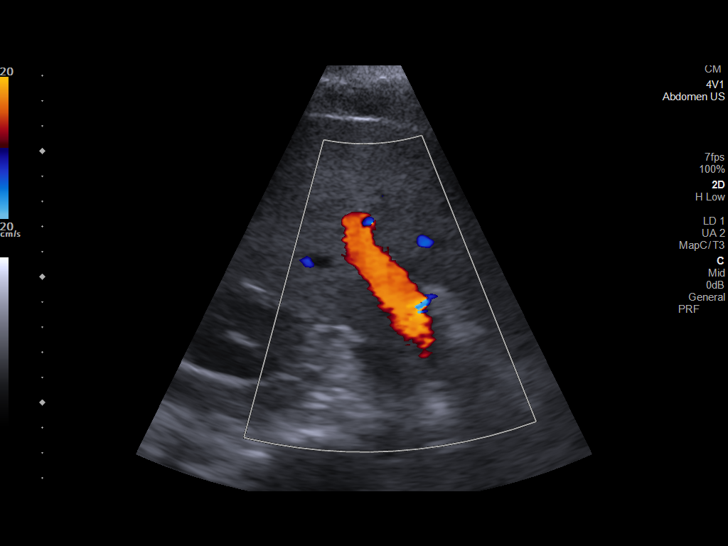

[14 of 25 positions shown; findings below may reference images not displayed]

FINDINGS: Gallbladder:

Surgically absent

Common bile duct:

Diameter: 0.4 cm

Liver:

No focal lesion identified. Increased hepatic parenchymal
echogenicity. Portal vein is patent on color Doppler imaging with
normal direction of blood flow towards the liver.

Other: None.
IMPRESSION: Echogenic liver.

Findings most commonly seen in hepatic steatosis, though may also
represent hepatitis and/or fibrosis.

## 2023-05-28 ENCOUNTER — Encounter: Payer: Self-pay | Admitting: Otolaryngology

## 2023-05-28 NOTE — Anesthesia Preprocedure Evaluation (Addendum)
Anesthesia Evaluation  Patient identified by MRN, date of birth, ID band Patient awake    Reviewed: Allergy & Precautions, H&P , NPO status , Patient's Chart, lab work & pertinent test results  History of Anesthesia Complications (+) history of anesthetic complications  Airway Mallampati: III  TM Distance: >3 FB Neck ROM: Full    Dental no notable dental hx. (+) Poor Dentition, Chipped   Pulmonary neg pulmonary ROS, Current SmokerPatient did not abstain from smoking. 04-10-23 Note from Dr. Rayetta Pigg: She is smoking 2 packs/day and has done so for a number of years, she has declined help with smoking cessation in the past.   Pulmonary exam normal breath sounds clear to auscultation       Cardiovascular + CAD  negative cardio ROS Normal cardiovascular exam+ dysrhythmias  Rhythm:Regular Rate:Normal  ECG: Sinus rhythm, 73 bpm  Holter/Event: 2016 showing paroxysmal SVT in short bursts and otherwise no sustained arrhythmia  Echo: 2016 Interpretation Summary  Technically difficult study due to chest wall/lung interference  Echo contrast utilized to enhance endocardial border definition  Normal left ventricular systolic function, ejection fraction 55 to 60%  Normal right ventricular systolic function  TTE: 08/2020 Summary 1. The left ventricle is normal in size with mildly increased wall thickness. 2. The left ventricular systolic function is normal, LVEF is visually estimated at 60-65%. 3. There is grade I diastolic dysfunction (impaired relaxation). 4. The left atrium is mildly dilated in size. 5. The right ventricle is mildly dilated in size, with normal systolic function  Coronary CT: 07/2020 IMPRESSION: -Non-calcified proximal and calcified LAD plaque resulting in less than 25% stenosis. -33% proximal D1 stenosis -40% mid LCx stenosis CAD-RADS 2: 25 - 49%; Mild non-obstructive  Stress test: SPECT 2016 Nuclear Perfusion  Findings: There is a small in size, mild in severity, fixed defect involving the mid anterior and apical anterior segments. This is consistent with attenuation artifact. Nuclear Wall Motion Findings: Post stress: Global systolic function is normal. The ejection fraction was greater than 65%.      Neuro/Psych  Headaches PSYCHIATRIC DISORDERS  Depression Bipolar Disorder Schizophrenia  negative neurological ROS  negative psych ROS   GI/Hepatic Neg liver ROS,GERD  Medicated and Controlled,,  Endo/Other  diabetes    Renal/GU negative Renal ROS  negative genitourinary   Musculoskeletal negative musculoskeletal ROS (+) Arthritis ,    Abdominal   Peds negative pediatric ROS (+)  Hematology negative hematology ROS (+) Blood dyscrasia, anemia   Anesthesia Other Findings Schizophrenia  Alcohol abuse Drug abuse   Bipolar 1 disorder  Depression  Anemia GERD   Arthritis Complication of anesthesia  Dysrhythmia Diabetes mellitus, type II (HCC) Hyperlipidemia Smokers' cough (HCC) Coronary artery disease Headache     Reproductive/Obstetrics negative OB ROS                              Anesthesia Physical Anesthesia Plan  ASA: 3  Anesthesia Plan: General ETT   Post-op Pain Management:    Induction: Intravenous  PONV Risk Score and Plan:   Airway Management Planned: Oral ETT  Additional Equipment:   Intra-op Plan:   Post-operative Plan: Extubation in OR  Informed Consent: I have reviewed the patients History and Physical, chart, labs and discussed the procedure including the risks, benefits and alternatives for the proposed anesthesia with the patient or authorized representative who has indicated his/her understanding and acceptance.     Dental Advisory Given  Plan Discussed  with: Anesthesiologist, CRNA and Surgeon  Anesthesia Plan Comments: (Patient consented for risks of anesthesia including but not limited to:  - adverse  reactions to medications - damage to eyes, teeth, lips or other oral mucosa - nerve damage due to positioning  - sore throat or hoarseness - Damage to heart, brain, nerves, lungs, other parts of body or loss of life  Patient voiced understanding.)         Anesthesia Quick Evaluation

## 2023-06-05 ENCOUNTER — Encounter: Payer: Self-pay | Admitting: Otolaryngology

## 2023-06-05 ENCOUNTER — Ambulatory Visit
Admission: RE | Admit: 2023-06-05 | Discharge: 2023-06-05 | Disposition: A | Discharge status: Home / Self Care | Payer: Medicare HMO | Attending: Otolaryngology | Admitting: Otolaryngology

## 2023-06-05 ENCOUNTER — Other Ambulatory Visit: Payer: Self-pay

## 2023-06-05 ENCOUNTER — Ambulatory Visit: Payer: Medicare HMO | Admitting: Anesthesiology

## 2023-06-05 ENCOUNTER — Encounter
Admission: RE | Disposition: A | Discharge status: Home / Self Care | Payer: Self-pay | Source: Home / Self Care | Attending: Otolaryngology

## 2023-06-05 DIAGNOSIS — F200 Paranoid schizophrenia: Secondary | ICD-10-CM | POA: Insufficient documentation

## 2023-06-05 DIAGNOSIS — E119 Type 2 diabetes mellitus without complications: Secondary | ICD-10-CM | POA: Diagnosis not present

## 2023-06-05 DIAGNOSIS — E785 Hyperlipidemia, unspecified: Secondary | ICD-10-CM | POA: Insufficient documentation

## 2023-06-05 DIAGNOSIS — F17200 Nicotine dependence, unspecified, uncomplicated: Secondary | ICD-10-CM | POA: Diagnosis not present

## 2023-06-05 DIAGNOSIS — K219 Gastro-esophageal reflux disease without esophagitis: Secondary | ICD-10-CM | POA: Diagnosis not present

## 2023-06-05 DIAGNOSIS — J3489 Other specified disorders of nose and nasal sinuses: Secondary | ICD-10-CM | POA: Diagnosis present

## 2023-06-05 DIAGNOSIS — Z79899 Other long term (current) drug therapy: Secondary | ICD-10-CM | POA: Diagnosis not present

## 2023-06-05 DIAGNOSIS — F319 Bipolar disorder, unspecified: Secondary | ICD-10-CM | POA: Insufficient documentation

## 2023-06-05 DIAGNOSIS — I2510 Atherosclerotic heart disease of native coronary artery without angina pectoris: Secondary | ICD-10-CM | POA: Diagnosis not present

## 2023-06-05 DIAGNOSIS — B078 Other viral warts: Secondary | ICD-10-CM | POA: Diagnosis not present

## 2023-06-05 HISTORY — DX: Other ill-defined heart diseases: I51.89

## 2023-06-05 HISTORY — PX: EXCISION NASAL MASS: SHX6271

## 2023-06-05 HISTORY — DX: Personal history of other mental and behavioral disorders: Z86.59

## 2023-06-05 HISTORY — DX: Personal history of other diseases of the nervous system and sense organs: Z86.69

## 2023-06-05 SURGERY — EXCISION, MASS, NOSE
Anesthesia: General | Site: Nose | Laterality: Left

## 2023-06-05 MED ORDER — BACITRACIN 500 UNIT/GM EX OINT: 1.0000 | TOPICAL_OINTMENT | Freq: Three times a day (TID) | CUTANEOUS | 0 refills | Status: AC

## 2023-06-05 MED ORDER — LIDOCAINE HCL (CARDIAC) PF 100 MG/5ML IV SOSY
PREFILLED_SYRINGE | INTRAVENOUS | Status: DC | PRN
  Administered 2023-06-05: 80 mg via INTRAVENOUS

## 2023-06-05 MED ORDER — SUCCINYLCHOLINE CHLORIDE 200 MG/10ML IV SOSY
PREFILLED_SYRINGE | INTRAVENOUS | Status: DC | PRN
  Administered 2023-06-05: 140 mg via INTRAVENOUS

## 2023-06-05 MED ORDER — OXYMETAZOLINE HCL 0.05 % NA SOLN
NASAL | Status: DC | PRN
  Administered 2023-06-05: 1 via TOPICAL

## 2023-06-05 MED ORDER — DEXMEDETOMIDINE HCL IN NACL 80 MCG/20ML IV SOLN
INTRAVENOUS | Status: DC | PRN
  Administered 2023-06-05: 4 ug via INTRAVENOUS

## 2023-06-05 MED ORDER — EPHEDRINE SULFATE-NACL 50-0.9 MG/10ML-% IV SOSY
PREFILLED_SYRINGE | INTRAVENOUS | Status: DC | PRN
  Administered 2023-06-05: 10 mg via INTRAVENOUS

## 2023-06-05 MED ORDER — OXYCODONE HCL 5 MG PO TABS: 10.0000 mg | ORAL_TABLET | Freq: Once | ORAL | Status: DC

## 2023-06-05 MED ORDER — DEXAMETHASONE SODIUM PHOSPHATE 4 MG/ML IJ SOLN
INTRAMUSCULAR | Status: DC | PRN
  Administered 2023-06-05: 8 mg via INTRAVENOUS

## 2023-06-05 MED ORDER — PROPOFOL 10 MG/ML IV BOLUS
INTRAVENOUS | Status: DC | PRN
  Administered 2023-06-05: 200 mg via INTRAVENOUS

## 2023-06-05 MED ORDER — ONDANSETRON HCL 4 MG/2ML IJ SOLN
INTRAMUSCULAR | Status: DC | PRN
  Administered 2023-06-05: 4 mg via INTRAVENOUS

## 2023-06-05 MED ORDER — 0.9 % SODIUM CHLORIDE (POUR BTL) OPTIME
TOPICAL | Status: DC | PRN
  Administered 2023-06-05: 500 mL

## 2023-06-05 MED ORDER — ACETAMINOPHEN 10 MG/ML IV SOLN: 1000.0000 mg | Freq: Once | INTRAVENOUS | Status: DC

## 2023-06-05 MED ORDER — LIDOCAINE-EPINEPHRINE 1 %-1:100000 IJ SOLN
INTRAMUSCULAR | Status: DC | PRN
  Administered 2023-06-05: 3 mL

## 2023-06-05 MED ORDER — FENTANYL CITRATE (PF) 100 MCG/2ML IJ SOLN
INTRAMUSCULAR | Status: DC | PRN
  Administered 2023-06-05: 50 ug via INTRAVENOUS

## 2023-06-05 MED ORDER — LACTATED RINGERS IV SOLN: INTRAVENOUS | Status: DC

## 2023-06-05 SURGICAL SUPPLY — 15 items
ANTIFOG SOL W/FOAM PAD STRL (MISCELLANEOUS) ×1
CANISTER SUCT 1200ML W/VALVE (MISCELLANEOUS) ×1 IMPLANT
ELECT REM PT RETURN 9FT ADLT (ELECTROSURGICAL) ×1
ELECTRODE REM PT RTRN 9FT ADLT (ELECTROSURGICAL) ×1 IMPLANT
GAUZE SPONGE 4X4 12PLY STRL (GAUZE/BANDAGES/DRESSINGS) ×1 IMPLANT
GLOVE SURG GAMMEX PI TX LF 7.5 (GLOVE) ×2 IMPLANT
GOWN L4 LG 24 PK N/S (GOWN DISPOSABLE) ×1 IMPLANT
KIT TURNOVER KIT A (KITS) ×1 IMPLANT
NS IRRIG 500ML POUR BTL (IV SOLUTION) ×1 IMPLANT
PACK ENT CUSTOM (PACKS) ×1 IMPLANT
PACKING NASAL EPIS 4X2.4 XEROG (MISCELLANEOUS) ×1 IMPLANT
PATTIES SURGICAL .5 X3 (DISPOSABLE) ×1 IMPLANT
SOLUTION ANTFG W/FOAM PAD STRL (MISCELLANEOUS) ×1 IMPLANT
SUCTION COAG ELEC 10 HAND CTRL (ELECTROSURGICAL) IMPLANT
SYR BULB IRRIG 60ML STRL (SYRINGE) ×1 IMPLANT

## 2023-06-05 NOTE — H&P (Signed)
..  History and Physical paper copy reviewed and updated date of procedure and will be scanned into system.  Patient seen and examined and marked.  

## 2023-06-05 NOTE — Transfer of Care (Signed)
Immediate Anesthesia Transfer of Care Note  Patient: Ann [name]  Procedure(s) Performed: ENDOSCOPIC EXCISION NASAL MASS (Left: Nose)  Patient Location: PACU  Anesthesia Type: General ETT  Level of Consciousness: awake, alert  and patient cooperative  Airway and Oxygen Therapy: Patient Spontanous Breathing and Patient connected to supplemental oxygen  Post-op Assessment: Post-op Vital signs reviewed, Patient's Cardiovascular Status Stable, Respiratory Function Stable, Patent Airway and No signs of Nausea or vomiting  Post-op Vital Signs: Reviewed and stable  Complications: No notable events documented.

## 2023-06-05 NOTE — Anesthesia Postprocedure Evaluation (Signed)
Anesthesia Post Note  Patient: Ann [name]  Procedure(s) Performed: ENDOSCOPIC EXCISION NASAL MASS (Left: Nose)  Patient location during evaluation: PACU Anesthesia Type: General Level of consciousness: awake and alert Pain management: pain level controlled Vital Signs Assessment: post-procedure vital signs reviewed and stable Respiratory status: spontaneous breathing, nonlabored ventilation, respiratory function stable and patient connected to nasal cannula oxygen Cardiovascular status: blood pressure returned to baseline and stable Postop Assessment: no apparent nausea or vomiting Anesthetic complications: no   No notable events documented.   Last Vitals:  Vitals:   06/05/23 1130 06/05/23 1145  BP: 122/64 (!) 100/57  Pulse: 72 80  Resp: 18 13  Temp: (!) 36.4 C   SpO2: 94% 94%    Last Pain:  Vitals:   06/05/23 1145  TempSrc:   PainSc: 0-No pain                 Erickson Yamashiro C Eneida Evers

## 2023-06-05 NOTE — Anesthesia Procedure Notes (Signed)
Procedure Name: Intubation Date/Time: 06/05/2023 11:05 AM  Performed by: Sevannah Madia, [place], CRNAPre-anesthesia Checklist: Patient identified, Emergency Drugs available, Suction available and Patient being monitored Patient Re-evaluated:Patient Re-evaluated prior to induction Oxygen Delivery Method: Circle system utilized Preoxygenation: Pre-oxygenation with 100% oxygen Induction Type: IV induction and Rapid sequence Ventilation: Mask ventilation without difficulty Laryngoscope Size: Mac and 3 Grade View: Grade I Tube type: Oral Tube size: 7.0 mm Number of attempts: 1 Airway Equipment and Method: Stylet and Oral airway Placement Confirmation: ETT inserted through vocal cords under direct vision, positive ETCO2 and breath sounds checked- equal and bilateral Secured at: 22 cm Tube secured with: Tape Dental Injury: Teeth and Oropharynx as per pre-operative assessment

## 2023-06-05 NOTE — Op Note (Signed)
..  06/05/2023  11:16 AM    Myra Gianotti  [id number]   Pre-Op Dx:  Left nasal mass  Post-op Dx: Same  Proc:   1)  Endoscopic excision of left nasal mass  Surg:  Roney Mans Veva Grimley  Anes:  General  EBL:  <47ml  Comp:  None  Findings: Left sided nasal mass excised with 15 blade scalpel  Procedure: After the patient was identified in holding and the benefits of the procedure were reviewed as well as the consent and risks.  The patient was taken to the operating room and with the patient in a comfortable supine position,  general orotracheal anesthesia was induced without difficulty.  A proper time-out was performed.  The patient next received preoperative Afrin spray for topical decongestion and vasoconstriction.  Several minutes were allowed for this to take effect.  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  Under endoscopic evaluation, a 15 blade scalpel was used to excise the mass in its entirety.  The base was cauterized with a bovie suction cautery and then bacitracin ointment was placed.  Care of the patient at this time was transferred to Anestheisa.   Dispo:   PACU  Plan: Bacitracin ointment to nasal cavity TID and follow up in 3 weeks.  Roney Mans Phoenyx Paulsen 06/05/2023 11:16 AM

## 2023-06-06 ENCOUNTER — Encounter: Payer: Self-pay | Admitting: Otolaryngology

## 2023-07-30 ENCOUNTER — Ambulatory Visit: Payer: Medicare HMO | Admitting: Orthopedic Surgery

## 2023-07-31 ENCOUNTER — Encounter: Payer: Self-pay | Admitting: Orthopedic Surgery

## 2023-07-31 ENCOUNTER — Other Ambulatory Visit (INDEPENDENT_AMBULATORY_CARE_PROVIDER_SITE_OTHER): Payer: Medicare HMO

## 2023-07-31 ENCOUNTER — Ambulatory Visit (INDEPENDENT_AMBULATORY_CARE_PROVIDER_SITE_OTHER): Payer: Medicare HMO | Admitting: Orthopedic Surgery

## 2023-07-31 VITALS — BP 104/64 | HR 62 | Ht 65.0 in | Wt 193.0 lb

## 2023-07-31 DIAGNOSIS — M545 Low back pain, unspecified: Secondary | ICD-10-CM | POA: Diagnosis not present

## 2023-07-31 NOTE — Patient Instructions (Signed)
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
## 2023-10-14 ENCOUNTER — Encounter (HOSPITAL_COMMUNITY): Payer: Self-pay | Admitting: Emergency Medicine

## 2023-10-14 ENCOUNTER — Emergency Department (HOSPITAL_COMMUNITY): Payer: Medicare HMO

## 2023-10-14 ENCOUNTER — Other Ambulatory Visit: Payer: Self-pay

## 2023-10-14 ENCOUNTER — Emergency Department (HOSPITAL_COMMUNITY)
Admission: EM | Admit: 2023-10-14 | Discharge: 2023-10-14 | Disposition: A | Discharge status: Home / Self Care | Payer: Medicare HMO | Attending: Emergency Medicine | Admitting: Emergency Medicine

## 2023-10-14 DIAGNOSIS — I2510 Atherosclerotic heart disease of native coronary artery without angina pectoris: Secondary | ICD-10-CM | POA: Insufficient documentation

## 2023-10-14 DIAGNOSIS — R197 Diarrhea, unspecified: Secondary | ICD-10-CM | POA: Insufficient documentation

## 2023-10-14 DIAGNOSIS — Z7984 Long term (current) use of oral hypoglycemic drugs: Secondary | ICD-10-CM | POA: Insufficient documentation

## 2023-10-14 DIAGNOSIS — S299XXA Unspecified injury of thorax, initial encounter: Secondary | ICD-10-CM | POA: Diagnosis present

## 2023-10-14 DIAGNOSIS — E119 Type 2 diabetes mellitus without complications: Secondary | ICD-10-CM | POA: Diagnosis not present

## 2023-10-14 DIAGNOSIS — J189 Pneumonia, unspecified organism: Secondary | ICD-10-CM

## 2023-10-14 DIAGNOSIS — Z20822 Contact with and (suspected) exposure to covid-19: Secondary | ICD-10-CM | POA: Diagnosis not present

## 2023-10-14 DIAGNOSIS — S2232XA Fracture of one rib, left side, initial encounter for closed fracture: Secondary | ICD-10-CM | POA: Insufficient documentation

## 2023-10-14 DIAGNOSIS — I509 Heart failure, unspecified: Secondary | ICD-10-CM | POA: Insufficient documentation

## 2023-10-14 DIAGNOSIS — J181 Lobar pneumonia, unspecified organism: Secondary | ICD-10-CM | POA: Diagnosis not present

## 2023-10-14 LAB — RESP PANEL BY RT-PCR (RSV, FLU A&B, COVID)  RVPGX2
Influenza A by PCR: NEGATIVE
Influenza B by PCR: NEGATIVE
Resp Syncytial Virus by PCR: NEGATIVE
SARS Coronavirus 2 by RT PCR: NEGATIVE

## 2023-10-14 LAB — GROUP A STREP BY PCR: Group A Strep by PCR: NOT DETECTED

## 2023-10-14 MED ORDER — BENZONATATE 100 MG PO CAPS: 200.0000 mg | ORAL_CAPSULE | Freq: Three times a day (TID) | ORAL | 0 refills | Status: AC | PRN

## 2023-10-14 MED ORDER — DOXYCYCLINE HYCLATE 100 MG PO TABS
100.0000 mg | ORAL_TABLET | Freq: Once | ORAL | Status: AC
  Administered 2023-10-14: 100 mg via ORAL
  Filled 2023-10-14: qty 1

## 2023-10-14 MED ORDER — DOXYCYCLINE HYCLATE 100 MG PO CAPS: 100.0000 mg | ORAL_CAPSULE | Freq: Two times a day (BID) | ORAL | 0 refills | Status: DC

## 2023-10-14 MED ORDER — BENZONATATE 100 MG PO CAPS
200.0000 mg | ORAL_CAPSULE | Freq: Once | ORAL | Status: AC
  Administered 2023-10-14: 200 mg via ORAL
  Filled 2023-10-14: qty 2
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
## 2023-10-14 NOTE — ED Notes (Signed)
Incentive spirometer given to patient and instructed on use. Pt demonstrated proper use and verbalized understanding of all discharge instructions.

## 2024-04-01 NOTE — Progress Notes (Unsigned)
 Ann [name], female    DOB: [birth year]    MRN: [id number]   Brief patient profile:  57  yowf former CNA  active smoker  referred to pulmonary clinic in [place]  04/02/2024 by Torrence Freeze NP  for chronic cough with doe x 2020 (never covid infected)  s/p ER in 10/14/23 and 02/18/2024 with flares of AB   Pt not previously seen by PCCM service.     History of Present Illness  04/02/2024  Pulmonary/ 1st office eval/ Jamerion Cabello / [facility] [facility]  Chief Complaint  Patient presents with   Establish Care   Cough  Dyspnea:  bringing stuff in from car / steps sometimes stops half way hips and knees slow her down Cough: some am congestion/ couple of coughs resolves but brown x sev months >  Sleep: flat bed / 2 pillows  SABA use: hfa  once a day  02: none  LDSCT:ordered   No obvious day to day or daytime pattern/variability or assoc  mucus plugs or hemoptysis or cp or chest tightness, subjective wheeze or overt   hb symptoms.    Also denies any obvious fluctuation of symptoms with weather or environmental changes or other aggravating or alleviating factors except as outlined above   No unusual exposure hx or h/o childhood pna/ asthma or knowledge of premature birth.  Current Allergies, Complete Past Medical History, Past Surgical History, Family History, and Social History were reviewed in [employer] record.  ROS  The following are not active complaints unless bolded Hoarseness, sore throat, dysphagia, dental problems, itching, sneezing,  nasal congestion or discharge of excess mucus or purulent secretions, ear ache,   fever, chills, sweats, unintended wt loss or wt gain, classically pleuritic or exertional cp,  orthopnea pnd or arm/hand swelling  or leg swelling, presyncope, palpitations, abdominal pain, anorexia, nausea, vomiting, diarrhea  or change in bowel habits or change in bladder habits, change in stools or change in urine, dysuria, hematuria,  rash, arthralgias,  visual complaints, headache, numbness, weakness or ataxia or problems with walking or coordination,  change in mood or  memory.            Outpatient Medications Prior to Visit - - NOTE:   Unable to verify as accurately reflecting what pt takes    Medication Sig Dispense Refill   gabapentin (NEURONTIN) 800 MG tablet Take 800 mg by mouth 3 (three) times daily.     hydrOXYzine (ATARAX) 25 MG tablet Take 25 mg by mouth 3 (three) times daily.     meloxicam (MOBIC) 15 MG tablet Take 15 mg by mouth daily.     NICOTINE STEP 1 21 MG/24HR patch Place 21 mg onto the skin daily.     VRAYLAR 1.5 MG capsule Take 1.5 mg by mouth daily. (Patient not taking: Reported on 04/02/2024)     albuterol  (VENTOLIN  HFA) 108 (90 Base) MCG/ACT inhaler Inhale 2 puffs into the lungs every 4 (four) hours as needed.     atenolol (TENORMIN) 25 MG tablet Take 25 mg by mouth daily.     benzonatate  (TESSALON ) 100 MG capsule Take 2 capsules (200 mg total) by mouth 3 (three) times daily as needed. 30 capsule 0           Ferrous Sulfate (IRON PO) Take 1,000 mg by mouth daily.     fluticasone (FLONASE) 50 MCG/ACT nasal spray Place 2 sprays into both nostrils 2 (two) times daily.     glipiZIDE  (GLUCOTROL  XL) 5 MG  24 hr tablet Take 1 tablet (5 mg total) by mouth daily with breakfast. 90 tablet 3   GNP ASPIRIN LOW DOSE 81 MG EC tablet Take 81 mg by mouth daily.     metFORMIN  (GLUCOPHAGE ) 1000 MG tablet Take 1 tablet (1,000 mg total) by mouth 2 (two) times daily with a meal. 180 tablet 3   montelukast (SINGULAIR) 10 MG tablet Take 10 mg by mouth at bedtime.     Multiple Vitamins-Minerals (CENTRUM SILVER ADULT 50+ PO) Take by mouth daily.     omeprazole (PRILOSEC) 20 MG capsule Take 40 mg by mouth 2 (two) times daily before a meal.     rosuvastatin (CRESTOR) 40 MG tablet Take 40 mg by mouth daily.     topiramate (TOPAMAX) 50 MG tablet Take 50 mg by mouth 2 (two) times daily. (Patient not taking: Reported on 04/02/2024)     TRUE METRIX  BLOOD GLUCOSE TEST test strip Use as instructed to monitor glucose twice daily 100 each 11   TRUEplus Lancets 33G MISC Use to monitor glucose twice daily 100 each 11   escitalopram (LEXAPRO) 10 MG tablet Take 10 mg by mouth daily.     gabapentin (NEURONTIN) 300 MG capsule Take 800 mg by mouth 3 (three) times daily.     VRAYLAR 3 MG capsule Take 3 mg by mouth daily.        Past Medical History:  Diagnosis Date   Alcohol abuse    Anemia    Arthritis    KNEE   Bipolar 1 disorder (HCC)    Complication of anesthesia    HARD TO WAKE UP WITH CERVIX SURGERY   Coronary artery disease    Depression    Diabetes mellitus, type II (HCC)    Drug abuse (HCC)    Dysrhythmia    SVT PER CARDIOLOGY NOTE IN 2016   GERD (gastroesophageal reflux disease)    Grade I diastolic dysfunction    H/O paranoid schizophrenia    Headache    History of migraine    History of posttraumatic stress disorder (PTSD)    Hyperlipidemia    Schizophrenia (HCC)    Smokers' cough (HCC)       Objective:     BP 132/85 (BP Location: Left Arm)   Pulse 70   Ht 5\' 5"  (1.651 m)   Wt 193 lb 6.4 oz (87.7 kg)   SpO2 94% Comment: RA  BMI 32.18 kg/m   SpO2: 94 % (RA)  amb wf mod obese by bmi/ tremulous    HEENT : Oropharynx  clear      Nasal turbinates nl    NECK :  without  apparent JVD/ palpable Nodes/TM    LUNGS: no acc muscle use,  Nl contour chest which is clear to A and P bilaterally without cough on insp or exp maneuvers   CV:  RRR  no s3 or murmur or increase in P2, and no edema   ABD:  quite obese soft and nontender   MS:  Gait nl   ext warm without deformities Or obvious joint restrictions  calf tenderness, cyanosis or clubbing    SKIN: warm and dry without lesions    NEURO:  alert, approp, nl sensorium with  no motor or cerebellar deficits apparent.       I personally reviewed images and agree with radiology impression as follows:  CXR:   02/18/24  Nl     Assessment   Asthmatic  bronchitis , chronic (HCC) Active  smoker  - Eos 0.2  02/18/24  - 04/02/2024  After extensive coaching inhaler device,  effectiveness =    25% hfa > continue saba prn -  04/02/2024   Walked on RA  x  3  lap(s) =  approx 450  ft  @ mod pace, stopped due to end of study with lowest 02 sats 94% and no sob   - PFT's 04/02/2024 ordered   She is not limited by doe but having AB  with CB symptoms as well likely largely driven by smoking so do not recommend maint rx until / unless she starts needing more saba (used correctly) or more freq flares of AB that can't be controlled with saba, at least until returns for full pfts to decide between symbicort 160 (for AB)  and breztri (if has dx of COPD)   Discussed in detail all the  indications, usual  risks and alternatives  relative to the benefits with patient who agrees to proceed with Rx as outlined.      F/u 3 m, sooner if needed   Each maintenance medication was reviewed in detail including emphasizing most importantly the difference between maintenance and prns and under what circumstances the prns are to be triggered using an action plan format where appropriate.  Total time for H and P, chart review, counseling, reviewing hfa device(s) , directly observing portions of ambulatory 02 saturation study/ and generating customized AVS unique to this office visit / same day charting = 45 min with new pt  for multiple  refractory respiratory  symptoms of uncertain etiology                  Cigarette smoker 4-5 min discussion re active cigarette smoking in addition to office E&M  Ask about tobacco use:   ongoing Advise quitting:   I took an extended  opportunity with this patient to outline the consequences of continued cigarette use  in airway disorders based on all the data we have from the multiple national lung health studies (perfomed over decades at millions of dollars in cost)  indicating that smoking cessation, not choice of inhalers or pulmonary  physicians, is the most important aspect of her  care.   Assess willingness:  Not committed at this point Assist in quit attempt:  Per PCP when ready Arrange follow up:   Follow up per Primary Care planned   Also Low-dose CT lung cancer screening is recommended for patients who are 69-4 years of age with a 20+ pack-year history of smoking and who are currently smoking or quit <=15 years ago. No coughing up blood  No unintentional weight loss of > 15 pounds in the last 6 months - pt is eligible for scanning yearly until 8 y p quits > referred         Vernestine Gondola, MD 04/02/2024

## 2024-04-02 ENCOUNTER — Ambulatory Visit: Admitting: Internal Medicine

## 2024-04-02 ENCOUNTER — Encounter: Payer: Self-pay | Admitting: Internal Medicine

## 2024-04-02 VITALS — BP 132/85 | HR 70 | Ht 65.0 in | Wt 193.4 lb

## 2024-04-02 DIAGNOSIS — J449 Chronic obstructive pulmonary disease, unspecified: Secondary | ICD-10-CM | POA: Diagnosis not present

## 2024-04-02 DIAGNOSIS — F17210 Nicotine dependence, cigarettes, uncomplicated: Secondary | ICD-10-CM | POA: Diagnosis not present

## 2024-04-02 DIAGNOSIS — J4489 Other specified chronic obstructive pulmonary disease: Secondary | ICD-10-CM | POA: Insufficient documentation

## 2024-04-02 NOTE — Patient Instructions (Addendum)
 My office will be contacting you by phone for referral to lung cancer screening   [phone]) and PFTs - if you don't hear back from my office within one week,  please call us  back or notify us  thru MyChart and we'll address it right away.     Work on inhaler technique:  relax and gently blow all the way out then take a nice smooth full deep breath back in, triggering the inhaler at same time you start breathing in.  Hold breath in for at least  5 seconds if you can.   Only use your albuterol  as a rescue medication to be used if you can't catch your breath by resting or doing a relaxed purse lip breathing pattern.  - The less you use it, the better it will work when you need it. - Ok to use up to 2 puffs  every 4 hours if you must but call for immediate appointment if use goes up over your usual need - Don't leave home without it !!  (think of it like starter fluid)  Also  Ok to try albuterol  15 min before an activity (on alternating days)  that you know would usually make you short of breath and see if it makes any difference and if makes none then don't take albuterol  after activity unless you can't catch your breath as this means it's the resting that helps, not the albuterol .   Please schedule a follow up visit in 3 months but call sooner if needed with pfts in meantime

## 2024-04-02 NOTE — Assessment & Plan Note (Addendum)
 Active smoker  - Eos 0.2  02/18/24  - 04/02/2024  After extensive coaching inhaler device,  effectiveness =    25% hfa > continue saba prn -  04/02/2024   Walked on RA  x  3  lap(s) =  approx 450  ft  @ mod pace, stopped due to end of study with lowest 02 sats 94% and no sob   - PFT's 04/02/2024 ordered   She is not limited by doe but having AB  with CB symptoms as well likely largely driven by smoking so do not recommend maint rx until / unless she starts needing more saba (used correctly) or more freq flares of AB that can't be controlled with saba, at least until returns for full pfts to decide between symbicort 160 (for AB)  and breztri (if has dx of COPD)   Discussed in detail all the  indications, usual  risks and alternatives  relative to the benefits with patient who agrees to proceed with Rx as outlined.      F/u 3 m, sooner if needed   Each maintenance medication was reviewed in detail including emphasizing most importantly the difference between maintenance and prns and under what circumstances the prns are to be triggered using an action plan format where appropriate.  Total time for H and P, chart review, counseling, reviewing hfa device(s) , directly observing portions of ambulatory 02 saturation study/ and generating customized AVS unique to this office visit / same day charting = 45 min with new pt  for multiple  refractory respiratory  symptoms of uncertain etiology

## 2024-04-02 NOTE — Assessment & Plan Note (Signed)
 4-5 min discussion re active cigarette smoking in addition to office E&M  Ask about tobacco use:   ongoing Advise quitting:   I took an extended  opportunity with this patient to outline the consequences of continued cigarette use  in airway disorders based on all the data we have from the multiple national lung health studies (perfomed over decades at millions of dollars in cost)  indicating that smoking cessation, not choice of inhalers or pulmonary physicians, is the most important aspect of her  care.   Assess willingness:  Not committed at this point Assist in quit attempt:  Per PCP when ready Arrange follow up:   Follow up per Primary Care planned   Also Low-dose CT lung cancer screening is recommended for patients who are 8-84 years of age with a 20+ pack-year history of smoking and who are currently smoking or quit <=15 years ago. No coughing up blood  No unintentional weight loss of > 15 pounds in the last 6 months - pt is eligible for scanning yearly until 15 y p quits > referred

## 2024-05-01 ENCOUNTER — Telehealth: Payer: Self-pay | Admitting: Acute Care

## 2024-05-01 DIAGNOSIS — Z87891 Personal history of nicotine dependence: Secondary | ICD-10-CM

## 2024-05-01 DIAGNOSIS — Z122 Encounter for screening for malignant neoplasm of respiratory organs: Secondary | ICD-10-CM

## 2024-05-01 DIAGNOSIS — F17210 Nicotine dependence, cigarettes, uncomplicated: Secondary | ICD-10-CM

## 2024-05-01 NOTE — Telephone Encounter (Signed)
 Lung Cancer Screening Narrative/Criteria Questionnaire (Cigarette Smokers Only- No Cigars/Pipes/vapes)   Genifer Simon   SDMV:05/13/2024 10:30 Mathis Som         Sep 12, 1971   LDCT: 05/18/2024 7:30a [place]    53 y.o.   Phone: [phone]  Lung Screening Narrative (confirm age 53-77 yrs Medicare / 50-80 yrs Private pay insurance)   Insurance information:[employer] mcr and mcd   Referring Provide: Dr. Waymond Hailey   This screening involves an initial phone call with a team member from our program. It is called a shared decision making visit. The initial meeting is required by  insurance and Medicare to make sure you understand the program. This appointment takes about 15-20 minutes to complete. You will complete the screening scan at your scheduled date/time.  This scan takes about 5-10 minutes to complete. You can eat and drink normally before and after the scan.  Criteria questions for Lung Cancer Screening:   Are you a current or former smoker? Current Age began smoking: 53yo   If you are a former smoker, what year did you quit smoking? Quit several times with a total of 6 years(within 15 yrs)   To calculate your smoking history, I need an accurate estimate of how many packs of cigarettes you smoked per day and for how many years. (Not just the number of PPD you are now smoking)   Years smoking 31 x Packs per day 1.5 = Pack years 46.5   (at least 20 pack yrs)   (Make sure they understand that we need to know how much they have smoked in the past, not just the number of PPD they are smoking now)  Do you have a personal history of cancer?  No    Do you have a family history of cancer? Yes  (cancer type and and relative) sister - ovarian. Sister - lung. Father - lung   Are you coughing up blood?  No  Have you had unexplained weight loss of 15 lbs or more in the last 6 months? No  It looks like you meet all criteria.  When would be a good time for us  to schedule you for this  screening?   Additional information: N/A

## 2024-05-04 ENCOUNTER — Other Ambulatory Visit: Payer: Self-pay | Admitting: Nurse Practitioner

## 2024-05-06 ENCOUNTER — Other Ambulatory Visit: Payer: Self-pay | Admitting: Nurse Practitioner

## 2024-05-13 ENCOUNTER — Ambulatory Visit: Admitting: Acute Care

## 2024-05-13 DIAGNOSIS — F17210 Nicotine dependence, cigarettes, uncomplicated: Secondary | ICD-10-CM

## 2024-05-13 NOTE — Patient Instructions (Signed)

## 2024-05-13 NOTE — Progress Notes (Signed)
 Virtual Visit via Telephone Note  I connected with Ann [name] on 05/13/24 at 10:30 AM EDT by telephone and verified that I am speaking with the correct person using two identifiers.  Location: Patient: Ann [name] Provider: Alyse Bach, RN   I discussed the limitations, risks, security and privacy concerns of performing an evaluation and management service by telephone and the availability of in person appointments. I also discussed with the patient that there may be a patient responsible charge related to this service. The patient expressed understanding and agreed to proceed.   Shared Decision Making Visit Lung Cancer Screening Program [phone])   Eligibility: Age 53 y.o. Pack Years Smoking History Calculation 46 (# packs/per year x # years smoked) Recent History of coughing up blood  no Unexplained weight loss? no ( >Than 15 pounds within the last 6 months ) Prior History Lung / other cancer no (Diagnosis within the last 5 years already requiring surveillance chest CT Scans). Smoking Status Current Smoker Former Smokers: Years since quit: n/a  Quit Date: n/a  Visit Components: Discussion included one or more decision making aids. yes Discussion included risk/benefits of screening. yes Discussion included potential follow up diagnostic testing for abnormal scans. yes Discussion included meaning and risk of over diagnosis. yes Discussion included meaning and risk of False Positives. yes Discussion included meaning of total radiation exposure. yes  Counseling Included: Importance of adherence to annual lung cancer LDCT screening. yes Impact of comorbidities on ability to participate in the program. yes Ability and willingness to under diagnostic treatment. yes  Smoking Cessation Counseling: Current Smokers:  Discussed importance of smoking cessation. yes Information about tobacco cessation classes and interventions provided to patient. yes Patient provided  with ticket for LDCT Scan. no Symptomatic Patient. no  Counseling(Intermediate counseling: > three minutes) 99406 Diagnosis Code: Tobacco Use Z72.0 Asymptomatic Patient yes  Counseling (Intermediate counseling: > three minutes counseling) U0454 Former Smokers:  Discussed the importance of maintaining cigarette abstinence. yes Diagnosis Code: Personal History of Nicotine Dependence. U98.119 Information about tobacco cessation classes and interventions provided to patient. Yes Patient provided with ticket for LDCT Scan. no Written Order for Lung Cancer Screening with LDCT placed in Epic. Yes (CT Chest Lung Cancer Screening Low Dose W/O CM) [id number] Z12.2-Screening of respiratory organs Z87.891-Personal history of nicotine dependence   Alyse Bach, RN

## 2024-05-18 ENCOUNTER — Ambulatory Visit (HOSPITAL_COMMUNITY)
Admission: RE | Admit: 2024-05-18 | Discharge: 2024-05-18 | Disposition: A | Discharge status: Home / Self Care | Source: Ambulatory Visit | Attending: Acute Care | Admitting: Acute Care

## 2024-05-18 DIAGNOSIS — F17210 Nicotine dependence, cigarettes, uncomplicated: Secondary | ICD-10-CM | POA: Diagnosis present

## 2024-05-18 DIAGNOSIS — Z87891 Personal history of nicotine dependence: Secondary | ICD-10-CM | POA: Insufficient documentation

## 2024-05-18 DIAGNOSIS — Z122 Encounter for screening for malignant neoplasm of respiratory organs: Secondary | ICD-10-CM | POA: Insufficient documentation

## 2024-05-25 ENCOUNTER — Telehealth: Payer: Self-pay | Admitting: *Deleted

## 2024-05-25 ENCOUNTER — Telehealth: Payer: Self-pay

## 2024-05-25 DIAGNOSIS — R911 Solitary pulmonary nodule: Secondary | ICD-10-CM

## 2024-05-25 DIAGNOSIS — Z87891 Personal history of nicotine dependence: Secondary | ICD-10-CM

## 2024-05-25 NOTE — Telephone Encounter (Unsigned)
 Copied from CRM [id number]. Topic: Clinical - Lab/Test Results >> May 25, 2024  1:44 PM Rozanna G wrote: Reason for CRM: PT CALLED STATED SHE HAS SOME MORE QUESTIONS FOR THE NURSE WHO SHE JUST SPOKE WITH ABOUT HER CT RESULTS

## 2024-05-25 NOTE — Telephone Encounter (Signed)
 Call report from [facility] radiology:  IMPRESSION: 1. 7.2 mm right lower lobe nodule. Lung-RADS 3, probably benign findings. Short-term follow-up in 6 months is recommended with repeat low-dose chest CT without contrast (please use the following order, CT CHEST LCS NODULE FOLLOW-UP W/O CM). These results will be called to the ordering clinician or representative by the Radiologist Assistant, and communication documented in the PACS or [employer]. 2.  Age advanced two vessel coronary artery calcification. 3.  Aortic atherosclerosis (ICD10-I70.0). 4.  Emphysema (ICD10-J43.9).  Spoke with patient and advised that small nodule was seen on CT Scan. No comparison scan available. 6 month repeat CT scan is advised. Patient verbalized understanding and is aware we will call her closer to 6 month to schedule repeat scan. Results/ plans faxed to PCP. Order placed for 6 month nodule follow up scan.

## 2024-05-28 NOTE — Telephone Encounter (Signed)
 Spoke with patient. She wanted to know which lung the nodule was seen in. Advised pt that nodule was seen in the right lung. Pt verbalized understanding and had no further questions.

## 2024-07-05 NOTE — Progress Notes (Unsigned)
 Ann [name], female    DOB: 1971-06-12    MRN: [id number]   Brief patient profile:  54  yowf former CNA  active smoker  referred to pulmonary clinic in [place]  04/02/2024 by Geni Clause NP  for chronic cough with doe x 2020 (never covid infected)  s/p ER in 10/14/23 and 02/18/2024 with flares of AB   Pt not previously seen by PCCM service.     History of Present Illness  04/02/2024  Pulmonary/ 1st office eval/ Brandy Zuba / [place] [facility]  Chief Complaint  Patient presents with   Establish Care   Cough  Dyspnea:  bringing stuff in from car / steps sometimes stops half way hips and knees slow her down Cough: some am congestion/ couple of coughs resolves but brown x sev months >  Sleep: flat bed / 2 pillows  SABA use: hfa  once a day  02: none  LDSCT:ordered  Rec     07/07/2024  f/u ov/[place] office/Ann [name] re: AB  *** smoker / maint on ***  No chief complaint on file.   Dyspnea:  *** Cough: *** Sleeping: ***   resp cc  SABA use: *** 02: ***  Lung cancer screening: ***   No obvious day to day or daytime variability or assoc excess/ purulent sputum or mucus plugs or hemoptysis or cp or chest tightness, subjective wheeze or overt sinus or hb symptoms.    Also denies any obvious fluctuation of symptoms with weather or environmental changes or other aggravating or alleviating factors except as outlined above   No unusual exposure hx or h/o childhood pna/ asthma or knowledge of premature birth.  Current Allergies, Complete Past Medical History, Past Surgical History, Family History, and Social History were reviewed in [employer] record.  ROS  The following are not active complaints unless bolded Hoarseness, sore throat, dysphagia, dental problems, itching, sneezing,  nasal congestion or discharge of excess mucus or purulent secretions, ear ache,   fever, chills, sweats, unintended wt loss or wt gain, classically pleuritic or exertional cp,  orthopnea  pnd or arm/hand swelling  or leg swelling, presyncope, palpitations, abdominal pain, anorexia, nausea, vomiting, diarrhea  or change in bowel habits or change in bladder habits, change in stools or change in urine, dysuria, hematuria,  rash, arthralgias, visual complaints, headache, numbness, weakness or ataxia or problems with walking or coordination,  change in mood or  memory.        No outpatient medications have been marked as taking for the 07/07/24 encounter (Appointment) with Darlean Ozell NOVAK, MD.           Past Medical History:  Diagnosis Date   Alcohol abuse    Anemia    Arthritis    KNEE   Bipolar 1 disorder (HCC)    Complication of anesthesia    HARD TO WAKE UP WITH CERVIX SURGERY   Coronary artery disease    Depression    Diabetes mellitus, type II (HCC)    Drug abuse (HCC)    Dysrhythmia    SVT PER CARDIOLOGY NOTE IN 2016   GERD (gastroesophageal reflux disease)    Grade I diastolic dysfunction    H/O paranoid schizophrenia    Headache    History of migraine    History of posttraumatic stress disorder (PTSD)    Hyperlipidemia    Schizophrenia (HCC)    Smokers' cough (HCC)       Objective:    Wt Readings from Last 3 Encounters:  04/02/24 193 lb 6.4 oz (87.7 kg)  10/14/23 190 lb (86.2 kg)  07/31/23 193 lb (87.5 kg)    Vital signs reviewed  07/07/2024  - Note at rest 02 sats  ***% on ***   General appearance:    ***              Assessment

## 2024-07-07 ENCOUNTER — Ambulatory Visit (INDEPENDENT_AMBULATORY_CARE_PROVIDER_SITE_OTHER): Admitting: Internal Medicine

## 2024-07-07 ENCOUNTER — Encounter: Payer: Self-pay | Admitting: Internal Medicine

## 2024-07-07 ENCOUNTER — Ambulatory Visit (HOSPITAL_COMMUNITY)
Admission: RE | Admit: 2024-07-07 | Discharge: 2024-07-07 | Disposition: A | Discharge status: Home / Self Care | Source: Ambulatory Visit | Attending: Internal Medicine | Admitting: Internal Medicine

## 2024-07-07 ENCOUNTER — Encounter (HOSPITAL_COMMUNITY)

## 2024-07-07 VITALS — BP 139/80 | HR 84 | Ht 65.0 in | Wt 197.4 lb

## 2024-07-07 DIAGNOSIS — R911 Solitary pulmonary nodule: Secondary | ICD-10-CM | POA: Diagnosis not present

## 2024-07-07 DIAGNOSIS — J4489 Other specified chronic obstructive pulmonary disease: Secondary | ICD-10-CM

## 2024-07-07 DIAGNOSIS — F17210 Nicotine dependence, cigarettes, uncomplicated: Secondary | ICD-10-CM | POA: Diagnosis not present

## 2024-07-07 LAB — PULMONARY FUNCTION TEST
DL/VA % pred: 104 %
DL/VA: 4.43 ml/min/mmHg/L
DLCO unc % pred: 83 %
DLCO unc: 17.3 ml/min/mmHg
FEF 25-75 Post: 1.42 L/s
FEF 25-75 Pre: 1.77 L/s
FEF2575-%Change-Post: -20 %
FEF2575-%Pred-Post: 54 %
FEF2575-%Pred-Pre: 68 %
FEV1-%Change-Post: 4 %
FEV1-%Pred-Post: 64 %
FEV1-%Pred-Pre: 61 %
FEV1-Post: 1.74 L
FEV1-Pre: 1.66 L
FEV1FVC-%Change-Post: -14 %
FEV1FVC-%Pred-Pre: 103 %
FEV6-%Change-Post: 23 %
FEV6-%Pred-Post: 73 %
FEV6-%Pred-Pre: 59 %
FEV6-Post: 2.45 L
FEV6-Pre: 1.98 L
FEV6FVC-%Change-Post: 0 %
FEV6FVC-%Pred-Post: 102 %
FEV6FVC-%Pred-Pre: 103 %
FVC-%Change-Post: 21 %
FVC-%Pred-Post: 71 %
FVC-%Pred-Pre: 58 %
FVC-Post: 2.46 L
FVC-Pre: 2.02 L
Post FEV1/FVC ratio: 71 %
Post FEV6/FVC ratio: 99 %
Pre FEV1/FVC ratio: 82 %
Pre FEV6/FVC Ratio: 100 %
RV % pred: 88 %
RV: 1.66 L
TLC % pred: 79 %
TLC: 4.03 L

## 2024-07-07 MED ORDER — ALBUTEROL SULFATE (2.5 MG/3ML) 0.083% IN NEBU
2.5000 mg | INHALATION_SOLUTION | Freq: Once | RESPIRATORY_TRACT | Status: AC
  Administered 2024-07-07 (×2): 2.5 mg via RESPIRATORY_TRACT

## 2024-07-07 MED ORDER — BUDESONIDE-FORMOTEROL FUMARATE 80-4.5 MCG/ACT IN AERO: INHALATION_SPRAY | RESPIRATORY_TRACT | 12 refills | Status: AC

## 2024-07-07 NOTE — Assessment & Plan Note (Addendum)
 LDSCT 05/18/24  RADS 3 7.2 mm right lower lobe nodule > f/u 6 m planned   Discussed in detail all the  indications, usual  risks and alternatives  relative to the benefits with patient who agrees to proceed with w/u as outlined.

## 2024-07-07 NOTE — Patient Instructions (Addendum)
 Plan A = Automatic = Always=    symbicort  80 Take 2 puffs first thing in am and then another 2 puffs about 12 hours later.    Work on inhaler technique:  relax and gently blow all the way out then take a nice smooth full deep breath back in, triggering the inhaler at same time you start breathing in.  Hold breath in for at least  5 seconds if you can. Blow out symbicort   thru nose. Rinse and gargle with water when done.  If mouth or throat bother you at all,  try brushing teeth/gums/tongue with arm and hammer toothpaste/ make a slurry and gargle and spit out.       Plan B = Backup (to supplement plan A, not to replace it) Use your albuterol  inhaler as a rescue medication to be used if you can't catch your breath by resting or slowing your pace  or doing a relaxed purse lip breathing pattern.  - The less you use it, the better it will work when you need it. - Ok to use the inhaler up to 2 puffs  every 4 hours if you must but call for appointment if use goes up over your usual need - Don't leave home without it !!  (think of it like the spare tire or starter fluid for your car)   Also  Ok to try albuterol  15 min before an activity (on alternating days)  that you know would usually make you short of breath and see if it makes any difference and if makes none then don't take albuterol  after activity unless you can't catch your breath as this means it's the resting that helps, not the albuterol .  For cough/ congestion > mucinex or mucinex dm  up to maximum of  1200 mg every 12 hours as needed  The key is to stop smoking completely before smoking completely stops you!        Please schedule a follow up visit in 3 months but call sooner if needed - bring inhalers

## 2024-07-07 NOTE — Assessment & Plan Note (Addendum)
 Counseled re importance of smoking cessation but did not meet time criteria for separate billing     Each maintenance medication was reviewed in detail including emphasizing most importantly the difference between maintenance and prns and under what circumstances the prns are to be triggered using an action plan format where appropriate.  Total time for H and P, chart review, counseling, reviewing hfa device(s) and generating customized AVS unique to this office visit / same day charting = 40 min summary f/u ov

## 2024-07-07 NOTE — Assessment & Plan Note (Addendum)
 GOLD 0/AB Active smoker  - Eos 0.2  02/18/24  - 04/02/2024  After extensive coaching inhaler device,  effectiveness =    25% hfa > continue saba prn -  04/02/2024   Walked on RA  x  3  lap(s) =  approx 450  ft  @ mod pace, stopped due to end of study with lowest 02 sats 94% and no sob   -LDSCT 05/18/24  RADS 3  Centrilobular and paraseptal emphysema. Smoking related respiratory bronchiolitis  - PFT's  07/07/2024   FEV1 1.74 (64 % ) ratio 0.71  p 4 % FEV1/ 21% FVC improvement from saba p saba 4 h prior to study with DLCO  17.30 (83%)   and FV curve nl  and ERV 30% at wt 198    - 07/07/2024  After extensive coaching inhaler device,  effectiveness =    75% hfa > try symbicort  80 2bid   She does not meet criteria for copd but is as risk so we'll call it GOLD 0 and f/u in 3 months on symbicort  80 2bid as maind and approp saba:  Re SABA :  I spent extra time with pt today reviewing appropriate use of albuterol  for prn use on exertion with the following points: 1) saba is for relief of sob that does not improve by walking a slower pace or resting but rather if the pt does not improve after trying this first. 2) If the pt is convinced, as many are, that saba helps recover from activity faster then it's easy to tell if this is the case by re-challenging : ie stop, take the inhaler, then p 5 minutes try the exact same activity (intensity of workload) that just caused the symptoms and see if they are substantially diminished or not after saba 3) if there is an activity that reproducibly causes the symptoms, try the saba 15 min before the activity on alternate days   If in fact the saba really does help, then fine to continue to use it prn but advised may need to look closer at the maintenance regimen being used to achieve better control of airways disease with exertion.   >>> mucinex dm prn cough   see avs for instructions unique to this ov

## 2024-07-11 ENCOUNTER — Ambulatory Visit: Payer: Self-pay | Admitting: Internal Medicine

## 2024-08-12 NOTE — Progress Notes (Signed)
 Virtual Visit via Telephone Note   I connected with Ann [name] on 05/13/24 at 10:30 AM EDT by telephone and verified that I am speaking with the correct person using two identifiers.   Location: Patient: at home Provider: [address], [place], [place], [address]    I discussed the limitations, risks, security and privacy concerns of performing an evaluation and management service by telephone and the availability of in person appointments. I also discussed with the patient that there may be a patient responsible charge related to this service. The patient expressed understanding and agreed to proceed.

## 2024-08-24 NOTE — Therapy (Incomplete)
 OUTPATIENT PHYSICAL THERAPY THORACOLUMBAR EVALUATION   Patient Name: Ann [name] MRN: [id number] DOB:1971-06-17, 53 y.o., female Today's Date: 08/24/2024  END OF SESSION:   Past Medical History:  Diagnosis Date   Alcohol abuse    Anemia    Arthritis    KNEE   Bipolar 1 disorder (HCC)    Complication of anesthesia    HARD TO WAKE UP WITH CERVIX SURGERY   Coronary artery disease    Depression    Diabetes mellitus, type II (HCC)    Drug abuse (HCC)    Dysrhythmia    SVT PER CARDIOLOGY NOTE IN 2016   GERD (gastroesophageal reflux disease)    Grade I diastolic dysfunction    H/O paranoid schizophrenia    Headache    History of migraine    History of posttraumatic stress disorder (PTSD)    Hyperlipidemia    Schizophrenia (HCC)    Smokers' cough (HCC)    Past Surgical History:  Procedure Laterality Date   CERVIX SURGERY     COLONOSCOPY N/A 07/05/2022   Procedure: COLONOSCOPY;  Surgeon: Onita Elspeth Sharper, DO;  Location: [facility] ENDOSCOPY;  Service: Gastroenterology;  Laterality: N/A;  Dr. Milissa to do excision from nose. Needs headlamp. OK'd per PM   COLONOSCOPY WITH PROPOFOL  N/A 01/24/2023   Procedure: COLONOSCOPY WITH PROPOFOL ;  Surgeon: Onita Elspeth Sharper, DO;  Location: [facility] ENDOSCOPY;  Service: Gastroenterology;  Laterality: N/A;   ESOPHAGOGASTRODUODENOSCOPY N/A 07/05/2022   Procedure: ESOPHAGOGASTRODUODENOSCOPY (EGD);  Surgeon: Onita Elspeth Sharper, DO;  Location: [facility] ENDOSCOPY;  Service: Gastroenterology;  Laterality: N/A;   EXCISION NASAL MASS Left 07/05/2022   Procedure: EXCISION NASAL LESION - joint case in ENDO;  Surgeon: Milissa Hamming, MD;  Location: [facility] ORS;  Service: ENT;  Laterality: Left;   EXCISION NASAL MASS Left 06/05/2023   Procedure: ENDOSCOPIC EXCISION NASAL MASS;  Surgeon: Milissa Hamming, MD;  Location[facility];  Service: ENT;  Laterality: Left;  Diabetic   EXCISION ORAL TUMOR Right 12/27/2021   Procedure: EXCISION OF SOFT PALATE  MASS;  Surgeon: Milissa Hamming, MD;  Location: [facility]ERY [facility];  Service: ENT;  Laterality: Right;  Diabetic   GALLBLADDER SURGERY  06/15/2020   HYSTEROSCOPY WITH D & C N/A 04/28/2018   Procedure: DILATATION AND CURETTAGE /HYSTEROSCOPY;  Surgeon: Ward, Mitzie BROCKS, MD;  Location: [facility] ORS;  Service: Gynecology;  Laterality: N/A;   POLYPECTOMY  04/28/2018   Procedure: POLYPECTOMY;  Surgeon: Ward, Mitzie BROCKS, MD;  Location: [facility] ORS;  Service: Gynecology;;   TUBAL LIGATION     Patient Active Problem List   Diagnosis Date Noted   Solitary pulmonary nodule on lung CT 07/07/2024   Asthmatic bronchitis , chronic (HCC) 04/02/2024   Cigarette smoker 04/02/2024   Thickened endometrium 04/28/2018   Postmenopausal bleeding 04/28/2018    PCP: Myra Geni ORN, FNP  REFERRING PROVIDER: Shari Sieving, MD  REFERRING DIAG: low back pain  Rationale for Evaluation and Treatment: Rehabilitation  THERAPY DIAG:  No diagnosis found.  ONSET DATE: ***  SUBJECTIVE:  SUBJECTIVE STATEMENT: ***  PERTINENT HISTORY:  ***  PAIN:  Are you having pain? {OPRCPAIN:27236}  PRECAUTIONS: {Therapy precautions:24002}  RED FLAGS: {PT Red Flags:29287}   WEIGHT BEARING RESTRICTIONS: {Yes ***/No:24003}  FALLS:  Has patient fallen in last 6 months? {fallsyesno:27318}  LIVING ENVIRONMENT: Lives with: {OPRC lives with:25569::lives with their family} Lives in: {Lives in:25570} Stairs: {opstairs:27293} Has following equipment at home: {Assistive devices:23999}  OCCUPATION: ***  PLOF: {PLOF:24004}  PATIENT GOALS: ***  NEXT MD VISIT: ***  OBJECTIVE:  Note: Objective measures were completed at Evaluation unless otherwise noted.  DIAGNOSTIC FINDINGS:  ***  PATIENT SURVEYS:  {rehab  surveys:24030}  COGNITION: Overall cognitive status: {cognition:24006}     SENSATION: {sensation:27233}  MUSCLE LENGTH: Hamstrings: Right *** deg; Left *** deg Debby test: Right *** deg; Left *** deg  POSTURE: {posture:25561}  PALPATION: ***  LUMBAR ROM:   AROM eval  Flexion   Extension   Right lateral flexion   Left lateral flexion   Right rotation   Left rotation    (Blank rows = not tested)  LOWER EXTREMITY ROM:     {AROM/PROM:27142}  Right eval Left eval  Hip flexion    Hip extension    Hip abduction    Hip adduction    Hip internal rotation    Hip external rotation    Knee flexion    Knee extension    Ankle dorsiflexion    Ankle plantarflexion    Ankle inversion    Ankle eversion     (Blank rows = not tested)  LOWER EXTREMITY MMT:    MMT Right eval Left eval  Hip flexion    Hip extension    Hip abduction    Hip adduction    Hip internal rotation    Hip external rotation    Knee flexion    Knee extension    Ankle dorsiflexion    Ankle plantarflexion    Ankle inversion    Ankle eversion     (Blank rows = not tested)  LUMBAR SPECIAL TESTS:  {lumbar special test:25242}  FUNCTIONAL TESTS:  {Functional tests:24029}  GAIT: Distance walked: *** Assistive device utilized: {Assistive devices:23999} Level of assistance: {Levels of assistance:24026} Comments: ***  TREATMENT DATE: 08/26/24 physical therapy evaluation and HEP instruction                                                                                                                                 PATIENT EDUCATION:  Education details: Patient educated on exam findings, POC, scope of PT, HEP, and ***. Person educated: Patient Education method: Explanation, Demonstration, and Handouts Education comprehension: verbalized understanding, returned demonstration, verbal cues required, and tactile cues required  HOME EXERCISE PROGRAM: ***  ASSESSMENT:  CLINICAL  IMPRESSION: Patient is a 53 y.o. female who was seen today for physical therapy evaluation and treatment for low back painlow back pain.   OBJECTIVE IMPAIRMENTS: {opptimpairments:25111}.   ACTIVITY LIMITATIONS: {activitylimitations:27494}  PARTICIPATION  LIMITATIONS: {participationrestrictions:25113}  PERSONAL FACTORS: {Personal factors:25162} are also affecting patient's functional outcome.   REHAB POTENTIAL: Good  CLINICAL DECISION MAKING: Evolving/moderate complexity  EVALUATION COMPLEXITY: Moderate   GOALS: Goals reviewed with patient? No  SHORT TERM GOALS: Target date: ***  patient will be independent with initial HEP  Baseline: Goal status: INITIAL  2.  *** Baseline:  Goal status: INITIAL  3.  Patient will report 50% improvement overall  Baseline:  Goal status: INITIAL  4.  *** Baseline:  Goal status: INITIAL  5.  *** Baseline:  Goal status: INITIAL  6.  *** Baseline:  Goal status: INITIAL  LONG TERM GOALS: Target date: ***  Patient will be independent in self management strategies to improve quality of life and functional outcomes.  Baseline:  Goal status: INITIAL  2.  Patient will report 70% improvement overall  Baseline:  Goal status: INITIAL  3.  *** Baseline:  Goal status: INITIAL  4.  *** Baseline:  Goal status: INITIAL  5.  *** Baseline:  Goal status: INITIAL  6.  *** Baseline:  Goal status: INITIAL  PLAN:  PT FREQUENCY: {rehab frequency:25116}  PT DURATION: {rehab duration:25117}  PLANNED INTERVENTIONS: 97164- PT Re-evaluation, 97110-Therapeutic exercises, 97530- Therapeutic activity, 97112- Neuromuscular re-education, 97535- Self Care, 02859- Manual therapy, [id number]- Gait training, [id number]- Orthotic Fit/training, [id number]- Canalith repositioning, [id number]- Aquatic Therapy, [id number]- Splinting, [id number]- Wound care (first 20 sq cm), 97598- Wound care (each additional 20 sq cm)Patient/Family education, Balance training, Stair training,  Taping, Dry Needling, Joint mobilization, Joint manipulation, Spinal manipulation, Spinal mobilization, Scar mobilization, and DME instructions. SABRA  PLAN FOR NEXT SESSION: Review HEP and goals;   12:53 PM, 08/25/24 Narek Kniss Small Shahzaib Azevedo MPT [facility] [place] [phone]

## 2024-08-26 ENCOUNTER — Ambulatory Visit (HOSPITAL_COMMUNITY): Attending: Orthopedic Surgery

## 2024-08-26 ENCOUNTER — Other Ambulatory Visit: Payer: Self-pay

## 2024-08-26 DIAGNOSIS — M545 Low back pain, unspecified: Secondary | ICD-10-CM | POA: Insufficient documentation

## 2024-08-26 DIAGNOSIS — R2689 Other abnormalities of gait and mobility: Secondary | ICD-10-CM | POA: Insufficient documentation

## 2024-08-26 DIAGNOSIS — R262 Difficulty in walking, not elsewhere classified: Secondary | ICD-10-CM | POA: Diagnosis present

## 2024-09-01 ENCOUNTER — Encounter (HOSPITAL_COMMUNITY): Admitting: Physical Therapy

## 2024-09-02 ENCOUNTER — Encounter (HOSPITAL_COMMUNITY): Admitting: Physical Therapy

## 2024-09-16 ENCOUNTER — Encounter (HOSPITAL_COMMUNITY)

## 2024-09-18 ENCOUNTER — Encounter (HOSPITAL_COMMUNITY)

## 2024-09-21 ENCOUNTER — Encounter (HOSPITAL_COMMUNITY)

## 2024-09-24 ENCOUNTER — Encounter (HOSPITAL_COMMUNITY): Admitting: Physical Therapy

## 2024-09-28 ENCOUNTER — Encounter (HOSPITAL_COMMUNITY)

## 2024-10-01 ENCOUNTER — Encounter (HOSPITAL_COMMUNITY)

## 2024-10-07 ENCOUNTER — Ambulatory Visit: Admitting: Internal Medicine

## 2024-10-14 ENCOUNTER — Ambulatory Visit (HOSPITAL_COMMUNITY)
Admission: RE | Admit: 2024-10-14 | Discharge: 2024-10-14 | Disposition: A | Discharge status: Home / Self Care | Source: Ambulatory Visit | Attending: Acute Care | Admitting: Acute Care

## 2024-10-14 DIAGNOSIS — R911 Solitary pulmonary nodule: Secondary | ICD-10-CM | POA: Diagnosis present

## 2024-10-14 DIAGNOSIS — Z87891 Personal history of nicotine dependence: Secondary | ICD-10-CM | POA: Diagnosis present

## 2024-10-26 ENCOUNTER — Other Ambulatory Visit: Payer: Self-pay

## 2024-10-26 DIAGNOSIS — Z122 Encounter for screening for malignant neoplasm of respiratory organs: Secondary | ICD-10-CM

## 2024-10-26 DIAGNOSIS — F17210 Nicotine dependence, cigarettes, uncomplicated: Secondary | ICD-10-CM

## 2024-10-26 DIAGNOSIS — Z87891 Personal history of nicotine dependence: Secondary | ICD-10-CM

## 2024-10-29 ENCOUNTER — Telehealth: Payer: Self-pay | Admitting: Acute Care

## 2024-10-29 NOTE — Telephone Encounter (Signed)
 Patient called in and left VM for us  to go over results with her from her LDCT. Results were already sent via mail. Called patient back and reviewed the findings from her LDCT. Patient was unaware of the T5 compression fracture and states she will discuss this with her PCP. Results already sent to PCP per protocol. All questions answered at the time of the call. Patient aware we will reach out to her when her annual is due.    IMPRESSION: 1. Lung-RADS 2, benign appearance or behavior. Continue annual screening with low-dose chest CT without contrast in 12 months. 2.  Age advanced two vessel coronary artery calcification. 3.  Aortic atherosclerosis (ICD10-I70.0). 4.  Emphysema (ICD10-J43.9).     Electronically Signed   By: Newell Eke M.D.   On: 10/23/2024 08:24

## 2024-11-11 ENCOUNTER — Ambulatory Visit (INDEPENDENT_AMBULATORY_CARE_PROVIDER_SITE_OTHER): Admitting: Internal Medicine

## 2024-11-11 ENCOUNTER — Encounter: Payer: Self-pay | Admitting: Internal Medicine

## 2024-11-11 VITALS — BP 83/53 | HR 84 | Ht 65.0 in | Wt 207.0 lb

## 2024-11-11 DIAGNOSIS — J4489 Other specified chronic obstructive pulmonary disease: Secondary | ICD-10-CM | POA: Diagnosis not present

## 2024-11-11 DIAGNOSIS — R911 Solitary pulmonary nodule: Secondary | ICD-10-CM

## 2024-11-11 DIAGNOSIS — F17210 Nicotine dependence, cigarettes, uncomplicated: Secondary | ICD-10-CM

## 2024-11-11 NOTE — Assessment & Plan Note (Addendum)
 LDSCT 05/18/24  RADS 3 7.2 mm right lower lobe nodule  -  10/14/24   RADS 2 > f/u yearly   Discussed in detail all the  indications, usual  risks and alternatives  relative to the benefits with patient who agrees to proceed with w/u as outlined.            Each maintenance medication was reviewed in detail including emphasizing most importantly the difference between maintenance and prns and under what circumstances the prns are to be triggered using an action plan format where appropriate.  Total time for H and P, chart review, counseling, reviewing hfa device(s) and generating customized AVS unique to this office visit / same day charting = 30 min

## 2024-11-11 NOTE — Patient Instructions (Addendum)
 The key is to stop smoking completely before smoking completely stops you!  No change medications    Please remember to go to the lab department   for your tests - we will call you with the results when they are available.   Please schedule a follow up visit in 6 months but call sooner if needed - bring inhalers

## 2024-11-11 NOTE — Assessment & Plan Note (Addendum)
 5  min discussion re active cigarette smoking in addition to office E&M  Ask about tobacco use:  ongoing  Advise quitting   I took an extended  opportunity with this patient to outline the consequences of continued cigarette use  in airway disorders based on all the data we have from the multiple national lung health studies (perfomed over decades at millions of dollars in cost)  indicating that smoking cessation, not choice of inhalers or pulmonary physicians, is the most important aspect of her care.   Assess willingness:  Not fully committed at this point and using both vapes and cigs  Assist in quit attempt:  Suggested e-cigs as an optiona but only as a  one way bridge  Off all tobacco products  Arrange follow up:   Follow up per Primary Care planned

## 2024-11-11 NOTE — Assessment & Plan Note (Addendum)
 GOLD 0/AB Active smoker  - Eos 0.2  02/18/24  - 04/02/2024  After extensive coaching inhaler device,  effectiveness =    25% hfa > continue saba prn -  04/02/2024   Walked on RA  x  3  lap(s) =  approx 450  ft  @ mod pace, stopped due to end of study with lowest 02 sats 94% and no sob   LDSCT 05/18/24  RADS 3  Centrilobular and paraseptal emphysema. Smoking related respiratory bronchiolitis - PFT's  07/07/2024   FEV1 1.74 (64 % ) ratio 0.71  p 4 % FEV1/ 21% FVC improvement from saba p saba 4 h prior to study with DLCO  17.30 (83%)   and FV curve nl  and ERV 30% at wt 198    - 07/07/2024  After extensive coaching inhaler device,  effectiveness =    75% hfa > try symbicort  80 2bid   - 11/11/2024  alpha one AT phenotype pending   All goals of chronic asthma control met including optimal function and elimination of symptoms with minimal need for rescue therapy.  Contingencies discussed in full including contacting this office immediately if not controlling the symptoms using the rule of two's.     F/u can be q 6 m

## 2024-11-11 NOTE — Progress Notes (Addendum)
 Ann [name], female    DOB: [birth year]    MRN: [id number]   Brief patient profile:  9 yowf former CNA  active smoker  referred to pulmonary clinic in [place]  04/02/2024 by Ann Clause NP  for chronic cough with doe x 2020 (never covid infected)  s/p ER in 10/14/23 and 02/18/2024 with flares of AB      History of Present Illness  04/02/2024  Pulmonary/ 1st office eval/ Ann [name] / [name]  Chief Complaint  Patient presents with   Establish Care   Cough  Dyspnea:  bringing stuff in from car / steps sometimes stops half way hips and knees slow her down Cough: some am congestion/ couple of coughs resolves but brown x sev months >  Sleep: flat bed / 2 pillows  SABA use: hfa  once a day  02: none  LDSCT:ordered  Rec  Work on inhaler technique:  .  Only use your albuterol  as a rescue medication  Also  Ok to try albuterol  15 min before an activity (on alternating days)  that you know would usually make you short of breath Please schedule a follow up visit in 3 months but call sooner if needed with pfts in meantime   LDSCT 05/18/24  RADS 3  Centrilobular and paraseptal emphysema. Smoking related respiratory bronchiolitis   07/07/2024  f/u ov/Ann [name] office/Ann [name] re: AB/emphysema and bronchiolitis on CT   maint on prn saba  active  smoker / Chief Complaint  Patient presents with   Lung Screening    LCS 05/18/24  Chest pain - shob - cough  Dyspnea:  running around chasing grandchildren  Cough: slt rattle variably discolored esp in am  Sleeping: flat bed / 2 pillow s  resp cc  SABA use: rarely hfa   02: none   Patient Instructions  Plan A = Automatic = Always=    symbicort  80   Work on inhaler technique:    Plan B = Backup (to supplement plan A, not to replace it) Use your albuterol  inhaler as a rescue medication   Also  Ok to try albuterol  15 min before an activity (on alternating days)  that you know would usually make you short of breath  For cough/ congestion >  mucinex or mucinex dm  up to maximum of  1200 mg every 12 hours as needed The key is to stop smoking completely before smoking completely stops you! Please schedule a follow up visit in 3 months but call sooner if needed - bring inhalers   LDSCT nodule f/u  10/14/24   RADS 2 > f/u yearly   11/11/2024  f/u ov/[place] office/Ann [name] re: GOLD 0  AB/ SPN  maint on symbicort  80 /singulair did not  bring inhalers  active/ vaper>  smoker Chief Complaint  Patient presents with   Shortness of Breath    Cough w mucus (green yellow brown) but not as much as use to.  Swallowing issues   Dyspnea:  chasing grandchildren Cough: less dark now  Sleeping: flat bed 2 pillow s noct  resp cc  SABA use: maybe once a day p ex  02: none Mouth dry longterm atarax >ent w/u in progress   No obvious day to day or daytime variability or assoc  mucus plugs or hemoptysis or cp or chest tightness, subjective wheeze or overt  hb symptoms.    Also denies any obvious fluctuation of symptoms with weather or environmental changes or other aggravating or alleviating factors except as  outlined above   No unusual exposure hx or h/o childhood pna/ asthma or knowledge of premature birth.  Current Allergies, Complete Past Medical History, Past Surgical History, Family History, and Social History were reviewed in Ann [name] record.  ROS  The following are not active complaints unless bolded Hoarseness, sore throat, dysphagia, dental problems, itching, sneezing,  nasal congestion or discharge of excess mucus or purulent secretions, ear ache,   fever, chills, sweats, unintended wt loss or wt gain, classically pleuritic or exertional cp,  orthopnea pnd or arm/hand swelling  or leg swelling, presyncope, palpitations, abdominal pain, anorexia, nausea, vomiting, diarrhea  or change in bowel habits or change in bladder habits, change in stools or change in urine, dysuria, hematuria,  rash, arthralgias, visual  complaints, headache, numbness, weakness or ataxia or problems with walking or coordination,  change in mood or  memory.         Outpatient Medications Prior to Visit  Medication Sig Dispense Refill   albuterol  (VENTOLIN  HFA) 108 (90 Base) MCG/ACT inhaler Inhale 2 puffs into the lungs every 4 (four) hours as needed.     atenolol (TENORMIN) 25 MG tablet Take 25 mg by mouth daily.     AUVELITY 45-105 MG TBCR Take by mouth.     benzonatate  (TESSALON ) 100 MG capsule Take 2 capsules (200 mg total) by mouth 3 (three) times daily as needed. 30 capsule 0   budesonide -formoterol  (SYMBICORT ) 80-4.5 MCG/ACT inhaler Take 2 puffs first thing in am and then another 2 puffs about 12 hours later. 1 each 12   Ferrous Sulfate (IRON PO) Take 1,000 mg by mouth daily.     fluticasone (FLONASE) 50 MCG/ACT nasal spray Place 2 sprays into both nostrils 2 (two) times daily.     gabapentin (NEURONTIN) 800 MG tablet Take 800 mg by mouth 3 (three) times daily.     GNP ASPIRIN LOW DOSE 81 MG EC tablet Take 81 mg by mouth daily.     hydrOXYzine (ATARAX) 25 MG tablet Take 25 mg by mouth 3 (three) times daily.     meloxicam (MOBIC) 15 MG tablet Take 15 mg by mouth daily.     metFORMIN  (GLUCOPHAGE ) 1000 MG tablet Take 1 tablet (1,000 mg total) by mouth 2 (two) times daily with a meal. 180 tablet 3   montelukast (SINGULAIR) 10 MG tablet Take 10 mg by mouth at bedtime.     Multiple Vitamins-Minerals (CENTRUM SILVER ADULT 50+ PO) Take by mouth daily.     NICOTINE STEP 1 21 MG/24HR patch Place 21 mg onto the skin daily.     omeprazole (PRILOSEC) 20 MG capsule Take 40 mg by mouth 2 (two) times daily before a meal.     rosuvastatin (CRESTOR) 40 MG tablet Take 40 mg by mouth daily.     TRUE METRIX BLOOD GLUCOSE TEST test strip Use as instructed to monitor glucose twice daily 100 each 11   TRUEplus Lancets 33G MISC Use to monitor glucose twice daily 100 each 11   No facility-administered medications prior to visit.      Past  Medical History:  Diagnosis Date   Alcohol abuse    Anemia    Arthritis    KNEE   Bipolar 1 disorder (HCC)    Complication of anesthesia    HARD TO WAKE UP WITH CERVIX SURGERY   Coronary artery disease    Depression    Diabetes mellitus, type II (HCC)    Drug abuse (HCC)  Dysrhythmia    SVT PER CARDIOLOGY NOTE IN 2016   GERD (gastroesophageal reflux disease)    Grade I diastolic dysfunction    H/O paranoid schizophrenia    Headache    History of migraine    History of posttraumatic stress disorder (PTSD)    Hyperlipidemia    Schizophrenia (HCC)    Smokers' cough (HCC)       Objective:    Wts  11/11/2024      207  07/07/24 197 lb 6.4 oz (89.5 kg)  04/02/24 193 lb 6.4 oz (87.7 kg)  10/14/23 190 lb (86.2 kg)    Vital signs reviewed  11/11/2024  - Note at rest 02 sats  98% on RA   General appearance:    somber wf nad   HEENT : Oropharynx  clear   Nasal turbinates nl   NECK :  without  apparent JVD/ palpable Nodes/TM    LUNGS: no acc muscle use,  Min barrel/ mild kyphotic   contour chest wall with bilateral  slightly decreased bs s audible wheeze and  without cough on insp or exp maneuvers and min  Hyperresonant  to  percussion bilaterally    CV:  RRR  no s3 or murmur or increase in P2, and no edema   ABD:  soft and nontender    MS:  Nl gait/ ext warm without deformities Or obvious joint restrictions  calf tenderness, cyanosis or clubbing     SKIN: warm and dry without lesions    NEURO:  alert, approp, nl sensorium with  no motor or cerebellar deficits apparent.              Assessment     Assessment & Plan Asthmatic bronchitis , chronic (HCC) GOLD 0/AB Active smoker  - Eos 0.2  02/18/24  - 04/02/2024  After extensive coaching inhaler device,  effectiveness =    25% hfa > continue saba prn -  04/02/2024   Walked on RA  x  3  lap(s) =  approx 450  ft  @ mod pace, stopped due to end of study with lowest 02 sats 94% and no sob   LDSCT 05/18/24  RADS 3   Centrilobular and paraseptal emphysema. Smoking related respiratory bronchiolitis - PFT's  07/07/2024   FEV1 1.74 (64 % ) ratio 0.71  p 4 % FEV1/ 21% FVC improvement from saba p saba 4 h prior to study with DLCO  17.30 (83%)   and FV curve nl  and ERV 30% at wt 198    - 07/07/2024  After extensive coaching inhaler device,  effectiveness =    75% hfa > try symbicort  80 2bid   - 11/11/2024  alpha one AT phenotype pending   All goals of chronic asthma control met including optimal function and elimination of symptoms with minimal need for rescue therapy.  Contingencies discussed in full including contacting this office immediately if not controlling the symptoms using the rule of two's.     F/u can be q 6 m   Solitary pulmonary nodule on lung CT LDSCT 05/18/24  RADS 3 7.2 mm right lower lobe nodule  -  10/14/24   RADS 2 > f/u yearly   Discussed in detail all the  indications, usual  risks and alternatives  relative to the benefits with patient who agrees to proceed with w/u as outlined.            Each maintenance medication was reviewed in detail including emphasizing most  importantly the difference between maintenance and prns and under what circumstances the prns are to be triggered using an action plan format where appropriate.  Total time for H and P, chart review, counseling, reviewing hfa device(s) and generating customized AVS unique to this office visit / same day charting = 30 min         Cigarette smoker 5  min discussion re active cigarette smoking in addition to office E&M  Ask about tobacco use:  ongoing  Advise quitting   I took an extended  opportunity with this patient to outline the consequences of continued cigarette use  in airway disorders based on all the data we have from the multiple national lung health studies (perfomed over decades at millions of dollars in cost)  indicating that smoking cessation, not choice of inhalers or pulmonary physicians, is the most  important aspect of her care.   Assess willingness:  Not fully committed at this point and using both vapes and cigs  Assist in quit attempt:  Suggested e-cigs as an optiona but only as a  one way bridge  Off all tobacco products  Arrange follow up:   Follow up per Primary Care planned         AVS  Patient Instructions  The key is to stop smoking completely before smoking completely stops you!  No change medications    Please remember to go to the lab department   for your tests - we will call you with the results when they are available.   Please schedule a follow up visit in 6 months but call sooner if needed - bring inhalers          Ozell America, MD 11/11/2024

## 2024-11-19 ENCOUNTER — Ambulatory Visit: Payer: Self-pay | Admitting: Internal Medicine

## 2024-11-19 LAB — ALPHA-1-ANTITRYPSIN PHENOTYP: A-1 Antitrypsin: 137 mg/dL (ref 101–187)

## 2024-11-23 NOTE — Progress Notes (Signed)
 Atc x1 lmtcb

## 2024-11-23 NOTE — Telephone Encounter (Signed)
 Copied from CRM #[id number]. Topic: Clinical - Medical Advice >> Nov 23, 2024  2:54 PM Devaughn RAMAN wrote: Reason for CRM: Pt would like to know how many additional lung nodules she has, contacted [facility] [place] no answer. Please f/u with pt.  Please advise

## 2024-11-25 NOTE — Telephone Encounter (Signed)
 Copied from CRM [id number]. Topic: Clinical - Lab/Test Results >> Oct 28, 2024  1:32 PM Isabell A wrote: Reason for CRM: Patient requesting a callback with results - CT CHEST LCS NODULE F/U LOW DOSE WO CONTRAST    Callback number: [phone]  SEE PHONE NOTE

## 2024-11-25 NOTE — Progress Notes (Signed)
 Informed pt and she confirmed

## 2024-12-03 ENCOUNTER — Encounter: Payer: Self-pay | Admitting: Cardiology

## 2024-12-03 ENCOUNTER — Ambulatory Visit: Attending: Cardiology | Admitting: Cardiology

## 2024-12-03 VITALS — BP 126/76 | HR 70 | Ht 64.0 in | Wt 209.0 lb

## 2024-12-03 DIAGNOSIS — E782 Mixed hyperlipidemia: Secondary | ICD-10-CM | POA: Diagnosis not present

## 2024-12-03 DIAGNOSIS — R0789 Other chest pain: Secondary | ICD-10-CM

## 2024-12-03 DIAGNOSIS — I10 Essential (primary) hypertension: Secondary | ICD-10-CM | POA: Diagnosis not present

## 2024-12-03 MED ORDER — ISOSORBIDE MONONITRATE ER 30 MG PO TB24: 15.0000 mg | ORAL_TABLET | Freq: Every day | ORAL | 3 refills | Status: AC

## 2024-12-03 NOTE — Progress Notes (Signed)
 "     Clinical Summary Ann [name] is a 54 y.o.female seen today as a new patient, previously followed by cardiology at [facility]   1.CAD - - has history of chronic chest pain. Also chronic generalized diffuse pain - midchest. Sharp/aching pain, 8/10 in severity. Typically occurs with activity. Can get dizzy at times with pain. Pain bilateral shoulders, pain upper and mid back. Better with deep breathing. Lasts few seconds to 5 minutes. More frequent that prior symptoms.  -MPI performed in 2016 with a fixed defect suggestive of artifact per the interpretation  -CCTA performed in 2021 with evidence of non-obstructive CAD  - 12/2023 nuclear stress no ischemia - sometimes can have pain after meals. Followed by GI    2. HTN - reports labile blood pressure.  - home bp's 150s-170s/90s - bp was low at most recent pulmonar appointment - limited oral hydration. Can have some orthostatic dizziness at times.   3. HLD - 02/2024 TC 181 TG 237 LDL not reported  07/2023 LDL 74  4. DM2 - followed by pcp  Past Medical History:  Diagnosis Date   Alcohol abuse    Anemia    Arthritis    KNEE   Bipolar 1 disorder (HCC)    Complication of anesthesia    HARD TO WAKE UP WITH CERVIX SURGERY   Coronary artery disease    Depression    Diabetes mellitus, type II (HCC)    Drug abuse (HCC)    Dysrhythmia    SVT PER CARDIOLOGY NOTE IN 2016   GERD (gastroesophageal reflux disease)    Grade I diastolic dysfunction    H/O paranoid schizophrenia    Headache    History of migraine    History of posttraumatic stress disorder (PTSD)    Hyperlipidemia    Schizophrenia (HCC)    Smokers' cough (HCC)      Allergies[1]   Current Outpatient Medications  Medication Sig Dispense Refill   albuterol  (VENTOLIN  HFA) 108 (90 Base) MCG/ACT inhaler Inhale 2 puffs into the lungs every 4 (four) hours as needed.     atenolol (TENORMIN) 25 MG tablet Take 25 mg by mouth daily.     AUVELITY 45-105 MG TBCR Take by  mouth.     benzonatate  (TESSALON ) 100 MG capsule Take 2 capsules (200 mg total) by mouth 3 (three) times daily as needed. 30 capsule 0   budesonide -formoterol  (SYMBICORT ) 80-4.5 MCG/ACT inhaler Take 2 puffs first thing in am and then another 2 puffs about 12 hours later. 1 each 12   Ferrous Sulfate (IRON PO) Take 1,000 mg by mouth daily.     fluticasone (FLONASE) 50 MCG/ACT nasal spray Place 2 sprays into both nostrils 2 (two) times daily.     gabapentin (NEURONTIN) 800 MG tablet Take 800 mg by mouth 3 (three) times daily.     GNP ASPIRIN LOW DOSE 81 MG EC tablet Take 81 mg by mouth daily.     hydrOXYzine (ATARAX) 25 MG tablet Take 25 mg by mouth 3 (three) times daily.     meloxicam (MOBIC) 15 MG tablet Take 15 mg by mouth daily.     metFORMIN  (GLUCOPHAGE ) 1000 MG tablet Take 1 tablet (1,000 mg total) by mouth 2 (two) times daily with a meal. 180 tablet 3   montelukast (SINGULAIR) 10 MG tablet Take 10 mg by mouth at bedtime.     Multiple Vitamins-Minerals (CENTRUM SILVER ADULT 50+ PO) Take by mouth daily.     NICOTINE STEP 1 21 MG/24HR  patch Place 21 mg onto the skin daily.     omeprazole (PRILOSEC) 20 MG capsule Take 40 mg by mouth 2 (two) times daily before a meal.     rosuvastatin (CRESTOR) 40 MG tablet Take 40 mg by mouth daily.     TRUE METRIX BLOOD GLUCOSE TEST test strip Use as instructed to monitor glucose twice daily 100 each 11   TRUEplus Lancets 33G MISC Use to monitor glucose twice daily 100 each 11   No current facility-administered medications for this visit.     Past Surgical History:  Procedure Laterality Date   CERVIX SURGERY     COLONOSCOPY N/A 07/05/2022   Procedure: COLONOSCOPY;  Surgeon: Onita Elspeth Sharper, DO;  Location: [facility] ENDOSCOPY;  Service: Gastroenterology;  Laterality: N/A;  Dr. Milissa to do excision from nose. Needs headlamp. OK'd per PM   COLONOSCOPY WITH PROPOFOL  N/A 01/24/2023   Procedure: COLONOSCOPY WITH PROPOFOL ;  Surgeon: Onita Elspeth Sharper, DO;   Location: [facility] ENDOSCOPY;  Service: Gastroenterology;  Laterality: N/A;   ESOPHAGOGASTRODUODENOSCOPY N/A 07/05/2022   Procedure: ESOPHAGOGASTRODUODENOSCOPY (EGD);  Surgeon: Onita Elspeth Sharper, DO;  Location: [facility] ENDOSCOPY;  Service: Gastroenterology;  Laterality: N/A;   EXCISION NASAL MASS Left 07/05/2022   Procedure: EXCISION NASAL LESION - joint case in ENDO;  Surgeon: Milissa Hamming, MD;  Location: [facility] ORS;  Service: ENT;  Laterality: Left;   EXCISION NASAL MASS Left 06/05/2023   Procedure: ENDOSCOPIC EXCISION NASAL MASS;  Surgeon: Milissa Hamming, MD;  Location: [facility];  Service: ENT;  Laterality: Left;  Diabetic   EXCISION ORAL TUMOR Right 12/27/2021   Procedure: EXCISION OF SOFT PALATE MASS;  Surgeon: Milissa Hamming, MD;  Location: [facility];  Service: ENT;  Laterality: Right;  Diabetic   GALLBLADDER SURGERY  06/15/2020   HYSTEROSCOPY WITH D & C N/A 04/28/2018   Procedure: DILATATION AND CURETTAGE /HYSTEROSCOPY;  Surgeon: Ward, Mitzie BROCKS, MD;  Location: [facility] ORS;  Service: Gynecology;  Laterality: N/A;   POLYPECTOMY  04/28/2018   Procedure: POLYPECTOMY;  Surgeon: Ward, Mitzie BROCKS, MD;  Location: [facility] ORS;  Service: Gynecology;;   TUBAL LIGATION       Allergies[2]    Family History  Problem Relation Age of Onset   Hypertension Mother    Diabetes Mother    Hyperlipidemia Mother    Heart attack Mother    Cancer Father      Social History Ann [name] reports that she has been smoking cigarettes and e-cigarettes. She started smoking about 38 years ago. She has a 57 pack-year smoking history. She has never used smokeless tobacco. Ann [name] reports that she does not currently use alcohol.    Physical Examination Today's Vitals   12/03/24 1019  BP: 126/76  Pulse: 70  SpO2: 98%  Weight: 209 lb (94.8 kg)  Height: 5' 4 (1.626 m)   Body mass index is 35.87 kg/m.  Gen: resting comfortably, no acute distress HEENT: no scleral icterus,  pupils equal round and reactive, no palptable cervical adenopathy,  CV: RRR, no, no mrg, no jvd Resp: Clear to auscultation bilaterally GI: abdomen is soft, non-tender, non-distended, normal bowel sounds, no hepatosplenomegaly MSK: extremities are warm, no edema.  Skin: warm, no rash Neuro:  no focal deficits Psych: appropriate affect   Diagnostic Studies  TTE 08/2020:  Summary 1. The left ventricle is normal in size with mildly increased wall thickness. 2. The left ventricular systolic function is normal, LVEF is visually estimated at 60-65%. 3. There is grade I diastolic  dysfunction (impaired relaxation). 4. The left atrium is mildly dilated in size. 5. The right ventricle is mildly dilated in size, with normal systolic function  Coronary CT: 07/2020 IMPRESSION: - Non-calcified proximal and calcified LAD plaque resulting in less than 25% stenosis.  - 33% proximal D1 stenosis - 40% mid LCx stenosis CAD-RADS 2:25 - 49%; Mild non-obstructive  Stress test: SPECT 2016 Nuclear Perfusion Findings: There is a small in size, mild in severity, fixed defect involving the mid anterior and apical anterior segments. This is consistent with attenuation artifact.   Nuclear Wall Motion Findings: Post stress: Global systolic function is normal. The ejection fraction was greater than 65%.  Lower extremity ABIs 2021:  Impression  Normal ankle brachial indices. No evidence of significant bilateral  lower extremity peripheral artery disease.    12/2023 nuclear stress Pharmacologic Nuclear Stress Test:  Impression  1. No reversible ischemia or infarction.   2. Normal left ventricular wall motion.   3. Left ventricular ejection fraction 72%   4. Non invasive risk stratification*: Low   Stress Findings  Stress Findings - A pharmacological stress test was performed using regadenoson 0.4mg  IV The patient with a peak HR of 82 bpm (49% of MPHR)  - The patient reported no symptoms during the  stress test.  - Blood pressure demonstrated a normal response to stress.   Resting ECG  Resting ECG - ECG is abnormal.  - Baseline shows normal sinus rhythm  - Non-specific ST T wave changes noted.   Stress ECG  Stress ECG - No significant ST segment changes were noted during stress or recovery  - There were no arrhythmias during stress.    Assessment and Plan  1.Chest pain - long history of symptoms, multiple ischemic tests at [facility] were benign most recently a nuclear stress test last year - unclear etiology, will try emperically imdur  15mg  for potential spasm or microvacular disease. She in general has chronic diffuse pains, chronic neck pains perhaps cervical radiculopathy - EKG today shows NSR, no acute ischemic changes  2. HTN - bp fine today. Home numbers reported high, she will bring a bp log and her home cuff in in 2 weeks to check - low bp at recent pulmonary appt, in general poor oral hydration. Discussed 2L of water daily intake.   3. HLD - labs followed by pcp, continue current meds      Dorn PHEBE Ross, M.D.     [1] No Known Allergies [2] No Known Allergies  "

## 2024-12-03 NOTE — Patient Instructions (Signed)
 Medication Instructions:  Your physician has recommended you make the following change in your medication:   -Start Imdur  15 mg once daily   *If you need a refill on your cardiac medications before your next appointment, please call your pharmacy*  Lab Work: NONE If you have labs (blood work) drawn today and your tests are completely normal, you will receive your results only by: MyChart Message (if you have MyChart) OR A paper copy in the mail If you have any lab test that is abnormal or we need to change your treatment, we will call you to review the results.  Testing/Procedures: NONE  Follow-Up: At [facility], you and your health needs are our priority.  As part of our continuing mission to provide you with exceptional heart care, our providers are all part of one team.  This team includes your primary Cardiologist (physician) and Advanced Practice Providers or APPs (Physician Assistants and Nurse Practitioners) who all work together to provide you with the care you need, when you need it.  Your next appointment:   6 month(s)  Provider:   You may see Dorn Ross, MD or one of the following Advanced Practice Providers on your designated Care Team:   Laymon Qua, PA-C  [place], [place] Olivia [place], [place]     We recommend signing up for the patient portal called MyChart.  Sign up information is provided on this After Visit Summary.  MyChart is used to connect with patients for Virtual Visits (Telemedicine).  Patients are able to view lab/test results, encounter notes, upcoming appointments, etc.  Non-urgent messages can be sent to your provider as well.   To learn more about what you can do with MyChart, go to [email or web address].   Other Instructions Your physician has requested that you regularly monitor and record your blood pressure readings at home. Please use the same machine at the same time of day to check your readings and record them to bring  to your nurse visit. Please bring your blood pressure cuff as well.

## 2024-12-08 ENCOUNTER — Ambulatory Visit: Admitting: Orthopedic Surgery

## 2024-12-15 ENCOUNTER — Ambulatory Visit: Attending: Otolaryngology

## 2024-12-15 DIAGNOSIS — G4733 Obstructive sleep apnea (adult) (pediatric): Secondary | ICD-10-CM | POA: Insufficient documentation

## 2024-12-18 ENCOUNTER — Encounter: Payer: Self-pay | Admitting: *Deleted

## 2024-12-18 ENCOUNTER — Encounter: Payer: Self-pay | Admitting: Cardiology

## 2024-12-18 ENCOUNTER — Ambulatory Visit: Attending: Cardiology | Admitting: *Deleted

## 2024-12-18 VITALS — BP 122/76 | HR 77 | Ht 64.0 in | Wt 206.6 lb

## 2024-12-18 DIAGNOSIS — R911 Solitary pulmonary nodule: Secondary | ICD-10-CM | POA: Diagnosis not present

## 2024-12-18 NOTE — Patient Instructions (Signed)
 Your physician recommends that you continue on your current medications as directed. Please refer to the Current Medication list given to you today. Your physician recommends that you schedule a follow-up appointment in: as planned Advised ED evaluation

## 2024-12-18 NOTE — Progress Notes (Signed)
 Presents for nurse visit per last office visit request by Dr. Alvan to have a blood pressure check for comparison with her home wrist cuff. Home BP readings brought for review. Home BP readings scanned to provider Medications reviewed Reports constant chest pain, and left side and back pain that started on 12/15/2024 rated 8/10.  Reports active chest pain rated 5/10. Reports dizziness earlier today when laying down. Denies SOB Vitals done and routed to provider for review Home cuff reading 127/79 & HR 77 Office reading 122/76 & HR 77

## 2024-12-21 ENCOUNTER — Encounter: Admitting: Obstetrics & Gynecology

## 2024-12-23 ENCOUNTER — Encounter: Admitting: Obstetrics & Gynecology

## 2024-12-23 ENCOUNTER — Ambulatory Visit: Payer: Self-pay | Admitting: Cardiology

## 2025-01-14 ENCOUNTER — Encounter: Admitting: Obstetrics & Gynecology
